# Patient Record
Sex: Female | Born: 1975
Health system: Southern US, Community
[De-identification: ages and names within clinical notes are randomized; demographics above are authoritative.]

## PROBLEM LIST (undated history)

## (undated) DIAGNOSIS — F79 Unspecified intellectual disabilities: Secondary | ICD-10-CM

## (undated) DIAGNOSIS — D219 Benign neoplasm of connective and other soft tissue, unspecified: Secondary | ICD-10-CM

## (undated) HISTORY — PX: OTHER SURGICAL HISTORY: SHX169

## (undated) HISTORY — PX: TONSILLECTOMY: SUR1361

## (undated) HISTORY — PX: HERNIA REPAIR: SHX51

## (undated) HISTORY — PX: ABDOMINAL HYSTERECTOMY: SHX81

## (undated) HISTORY — DX: Benign neoplasm of connective and other soft tissue, unspecified: D21.9

## (undated) HISTORY — PX: DILATION AND CURETTAGE OF UTERUS: SHX78

---

## 1997-06-23 ENCOUNTER — Ambulatory Visit (HOSPITAL_COMMUNITY): Admission: RE | Admit: 1997-06-23 | Discharge: 1997-06-23 | Payer: Self-pay

## 1997-08-04 ENCOUNTER — Other Ambulatory Visit: Admission: RE | Admit: 1997-08-04 | Discharge: 1997-08-04 | Payer: Self-pay | Admitting: Obstetrics and Gynecology

## 1997-11-09 ENCOUNTER — Encounter: Admission: RE | Admit: 1997-11-09 | Discharge: 1997-11-09 | Payer: Self-pay | Admitting: Family Medicine

## 1997-11-16 ENCOUNTER — Encounter: Admission: RE | Admit: 1997-11-16 | Discharge: 1997-11-16 | Payer: Self-pay | Admitting: Family Medicine

## 1998-02-08 ENCOUNTER — Encounter: Admission: RE | Admit: 1998-02-08 | Discharge: 1998-02-08 | Payer: Self-pay | Admitting: Family Medicine

## 1998-02-15 ENCOUNTER — Encounter: Admission: RE | Admit: 1998-02-15 | Discharge: 1998-02-15 | Payer: Self-pay | Admitting: Family Medicine

## 1998-08-11 ENCOUNTER — Encounter: Admission: RE | Admit: 1998-08-11 | Discharge: 1998-08-11 | Payer: Self-pay | Admitting: Family Medicine

## 1998-08-21 ENCOUNTER — Encounter: Admission: RE | Admit: 1998-08-21 | Discharge: 1998-08-21 | Payer: Self-pay | Admitting: Family Medicine

## 1999-04-04 ENCOUNTER — Encounter: Admission: RE | Admit: 1999-04-04 | Discharge: 1999-04-04 | Payer: Self-pay | Admitting: Family Medicine

## 1999-06-17 ENCOUNTER — Encounter (INDEPENDENT_AMBULATORY_CARE_PROVIDER_SITE_OTHER): Payer: Self-pay | Admitting: *Deleted

## 1999-06-26 ENCOUNTER — Encounter: Admission: RE | Admit: 1999-06-26 | Discharge: 1999-06-26 | Payer: Self-pay | Admitting: Family Medicine

## 2000-07-16 ENCOUNTER — Encounter: Admission: RE | Admit: 2000-07-16 | Discharge: 2000-07-16 | Payer: Self-pay | Admitting: Family Medicine

## 2000-12-17 ENCOUNTER — Encounter: Admission: RE | Admit: 2000-12-17 | Discharge: 2000-12-17 | Payer: Self-pay | Admitting: Sports Medicine

## 2001-01-21 ENCOUNTER — Encounter: Admission: RE | Admit: 2001-01-21 | Discharge: 2001-01-21 | Payer: Self-pay | Admitting: Family Medicine

## 2001-06-16 ENCOUNTER — Encounter: Admission: RE | Admit: 2001-06-16 | Discharge: 2001-06-16 | Payer: Self-pay | Admitting: Family Medicine

## 2001-12-23 ENCOUNTER — Encounter: Admission: RE | Admit: 2001-12-23 | Discharge: 2001-12-23 | Payer: Self-pay | Admitting: Family Medicine

## 2002-11-03 ENCOUNTER — Encounter: Admission: RE | Admit: 2002-11-03 | Discharge: 2002-11-03 | Payer: Self-pay | Admitting: Family Medicine

## 2003-01-21 ENCOUNTER — Encounter: Admission: RE | Admit: 2003-01-21 | Discharge: 2003-01-21 | Payer: Self-pay | Admitting: Sports Medicine

## 2003-01-26 ENCOUNTER — Encounter: Admission: RE | Admit: 2003-01-26 | Discharge: 2003-01-26 | Payer: Self-pay | Admitting: Family Medicine

## 2003-03-25 ENCOUNTER — Encounter: Admission: RE | Admit: 2003-03-25 | Discharge: 2003-03-25 | Payer: Self-pay | Admitting: Family Medicine

## 2003-05-24 ENCOUNTER — Encounter: Admission: RE | Admit: 2003-05-24 | Discharge: 2003-05-24 | Payer: Self-pay | Admitting: Family Medicine

## 2003-06-20 ENCOUNTER — Encounter: Admission: RE | Admit: 2003-06-20 | Discharge: 2003-06-20 | Payer: Self-pay | Admitting: Family Medicine

## 2004-01-26 ENCOUNTER — Ambulatory Visit: Payer: Self-pay | Admitting: Sports Medicine

## 2004-06-27 ENCOUNTER — Ambulatory Visit: Payer: Self-pay | Admitting: Family Medicine

## 2004-07-17 ENCOUNTER — Ambulatory Visit: Payer: Self-pay | Admitting: Family Medicine

## 2004-07-24 ENCOUNTER — Encounter: Admission: RE | Admit: 2004-07-24 | Discharge: 2004-07-24 | Payer: Self-pay | Admitting: Sports Medicine

## 2005-07-17 ENCOUNTER — Ambulatory Visit: Payer: Self-pay | Admitting: Family Medicine

## 2005-08-08 ENCOUNTER — Ambulatory Visit: Payer: Self-pay | Admitting: Sports Medicine

## 2005-08-26 ENCOUNTER — Ambulatory Visit: Payer: Self-pay | Admitting: Sports Medicine

## 2005-08-28 ENCOUNTER — Encounter: Admission: RE | Admit: 2005-08-28 | Discharge: 2005-08-28 | Payer: Self-pay | Admitting: Family Medicine

## 2006-02-28 ENCOUNTER — Ambulatory Visit: Payer: Self-pay | Admitting: Sports Medicine

## 2006-05-15 DIAGNOSIS — E663 Overweight: Secondary | ICD-10-CM

## 2006-05-15 DIAGNOSIS — J309 Allergic rhinitis, unspecified: Secondary | ICD-10-CM | POA: Insufficient documentation

## 2006-05-15 DIAGNOSIS — J45990 Exercise induced bronchospasm: Secondary | ICD-10-CM | POA: Insufficient documentation

## 2006-05-15 DIAGNOSIS — F79 Unspecified intellectual disabilities: Secondary | ICD-10-CM | POA: Insufficient documentation

## 2006-05-15 DIAGNOSIS — I10 Essential (primary) hypertension: Secondary | ICD-10-CM

## 2006-05-16 ENCOUNTER — Encounter (INDEPENDENT_AMBULATORY_CARE_PROVIDER_SITE_OTHER): Payer: Self-pay | Admitting: *Deleted

## 2007-12-25 ENCOUNTER — Ambulatory Visit: Payer: Self-pay | Admitting: Family Medicine

## 2008-06-24 ENCOUNTER — Ambulatory Visit: Payer: Self-pay | Admitting: Family Medicine

## 2010-01-12 ENCOUNTER — Ambulatory Visit: Payer: Self-pay | Admitting: Family Medicine

## 2010-04-19 NOTE — Assessment & Plan Note (Signed)
Summary: cpe,tcb   Vital Signs:  Patient profile:   35 year old female Height:      57.75 inches Weight:      158 pounds Temp:     98.6 degrees F oral Pulse rate:   80 / minute Pulse rhythm:   regular BP sitting:   149 / 100  (left arm) Cuff size:   regular  Vitals Entered By: Loralee Pacas CMA (January 12, 2010 3:47 PM)  Serial Vital Signs/Assessments:  Time      Position  BP       Pulse  Resp  Temp     By 3:51 PM             139/94   81                    Loralee Pacas CMA  CC: CPE   Primary Care Provider:  Jamie Brookes MD  CC:  CPE.  History of Present Illness: no concerns  wants to bowl with [employer].   here with aunt who wants to change diet rather than start meds.  currently eating high sal/fat diet  Current Medications (verified): 1)  None  Allergies (verified): No Known Drug Allergies  Review of Systems       The patient complains of weight gain.  The patient denies anorexia, fever, and weight loss.    Physical Exam  General:  vs reviewedwell-developed, well-nourished, and well-hydrated.   Head:  normocephalic and atraumatic.   Eyes:  vision grossly intact.   Ears:  R ear normal and L ear normal.  let ear with tip of qtip inside Nose:  clear rhinorhea Mouth:  mmm Lungs:  Normal respiratory effort, chest expands symmetrically. Lungs are clear to auscultation, no crackles or wheezes. Heart:  Normal rate and regular rhythm. S1 and S2 normal without gallop, murmur, click, rub or other extra sounds. Abdomen:  Bowel sounds positive,abdomen soft and non-tender without masses, organomegaly or hernias noted. Genitalia:  deferred Skin:  Intact without suspicious lesions or rashes Psych:  normally interactive and good eye contact.     Impression & Recommendations:  Problem # 1:  HEALTH SCREENING (ICD-V70.0) Assessment Unchanged unable to do pap, pt intolerant.  no sexual activity.  BP elevated.  aunt to help pt change diet.  RTC in 3 months for  recheck, consider starting BP meds.   Orders: [facility] - Est  18-39 yrs [phone])  Other Orders: Future Orders: [facility] [phone]) ... 01/10/2011  Patient Instructions: 1)  Please make an appt  to come back for lab work.  we will check your blood salts and sugars.  After you get that done, Dr. Clotilde Dieter will talk about starting a medication for your blood pressure.   2)  Have fun bowling!   Orders Added: 1)  [facility] [[id number]] 2)  [facility] - Est  18-39 yrs [[id number]]  Appended Document: cpe,tcb     Vision Screening:Left eye w/o correction: 20 / 25 Right Eye w/o correction: 20 / 25 Both eyes w/o correction:  20/ 25        Vision Entered By: Loralee Pacas CMA (January 12, 2010 4:39 PM)  Hearing Screen  20db HL: Left  500 hz: 20db 1000 hz: 20db 2000 hz: 20db 4000 hz: 20db Right  500 hz: 20db 1000 hz: 20db 2000 hz: 20db 4000 hz: 20db   Hearing Testing Entered By: Loralee Pacas CMA (January 12, 2010 4:39 PM)   Allergies: No Known Drug  Allergies

## 2010-10-22 ENCOUNTER — Ambulatory Visit: Payer: Self-pay | Admitting: Sports Medicine

## 2011-06-11 ENCOUNTER — Encounter: Payer: Self-pay | Admitting: Family Medicine

## 2011-06-11 ENCOUNTER — Ambulatory Visit (INDEPENDENT_AMBULATORY_CARE_PROVIDER_SITE_OTHER): Payer: Medicare Other | Admitting: Family Medicine

## 2011-06-11 VITALS — BP 110/82 | HR 86 | Temp 97.9°F | Ht <= 58 in | Wt 154.0 lb

## 2011-06-11 DIAGNOSIS — Z0000 Encounter for general adult medical examination without abnormal findings: Secondary | ICD-10-CM

## 2011-06-13 ENCOUNTER — Encounter: Payer: Self-pay | Admitting: Family Medicine

## 2011-06-13 NOTE — Progress Notes (Signed)
Subjective:     Catherine Hays is an 36 y.o. female who presents for summer camp physical exam. She and her mother deny any current medical problems or concerns. Questionnaire and forms reviewed with patient and responses verified. Immunizations are up to date. Patient vaccinations are up to date.  The following portions of the patient's history were reviewed and updated as appropriate: allergies, current medications, past family history, past medical history, past social history, past surgical history and problem list.  Review of Systems A comprehensive review of systems was negative.     Objective:    BP 110/82  Pulse 86  Temp(Src) 97.9 F (36.6 C) (Oral)  Ht 4\' 10"  (1.473 m)  Wt 154 lb (69.854 kg)  BMI 32.19 kg/m2 BP 110/82  Pulse 86  Temp(Src) 97.9 F (36.6 C) (Oral)  Ht 4\' 10"  (1.473 m)  Wt 154 lb (69.854 kg)  BMI 32.19 kg/m2  General Appearance:    Alert, cooperative, no distress, appears stated age  Head:    Normocephalic, without obvious abnormality, atraumatic  Eyes:    PERRL, conjunctiva/corneas clear, EOM's intact  Ears:    Normal TM's and external ear canals, both ears  Nose:   Nares normal, septum midline, mucosa normal, no drainage    or sinus tenderness  Throat:   Lips, mucosa, and tongue normal; teeth and gums normal  Neck:   Supple, symmetrical, trachea midline, no adenopathy;    thyroid:  no enlargement/tenderness/nodules; no carotid   bruit or JVD  Back:     Symmetric, no curvature, ROM normal, no CVA tenderness  Lungs:     Clear to auscultation bilaterally, respirations unlabored  Chest Wall:    No tenderness or deformity   Heart:    Regular rate and rhythm, S1 and S2 normal, no murmur, rub   or gallop  Breast Exam:    Deferred  Abdomen:     Soft, non-tender, bowel sounds active all four quadrants,    no masses, no organomegaly  Genitalia:    Deferred  Rectal:   Deferred  Extremities:   Extremities normal, atraumatic, no cyanosis or edema  Pulses:    2+ and symmetric all extremities  Skin:   Skin color, texture, turgor normal, no rashes or lesions  Lymph nodes:   Cervical, supraclavicular, and axillary nodes normal  Neurologic:   CNII-XII grossly intact, normal strength      Assessment:    Satisfactory summer camp physical exam in a 36 year old female with mental disability.    Plan:    1. Completed, signed and returned forms. 2. Reviewed health maintenance. 3. Follow up will be as needed.

## 2013-10-15 ENCOUNTER — Ambulatory Visit (INDEPENDENT_AMBULATORY_CARE_PROVIDER_SITE_OTHER): Payer: Medicare Other | Admitting: Family Medicine

## 2013-10-15 ENCOUNTER — Encounter: Payer: Self-pay | Admitting: Family Medicine

## 2013-10-15 VITALS — BP 136/71 | HR 70 | Temp 99.3°F | Wt 170.0 lb

## 2013-10-15 DIAGNOSIS — N920 Excessive and frequent menstruation with regular cycle: Secondary | ICD-10-CM

## 2013-10-15 DIAGNOSIS — R5381 Other malaise: Secondary | ICD-10-CM

## 2013-10-15 DIAGNOSIS — R5383 Other fatigue: Secondary | ICD-10-CM

## 2013-10-15 DIAGNOSIS — N970 Female infertility associated with anovulation: Secondary | ICD-10-CM

## 2013-10-15 DIAGNOSIS — N938 Other specified abnormal uterine and vaginal bleeding: Secondary | ICD-10-CM | POA: Insufficient documentation

## 2013-10-15 DIAGNOSIS — N921 Excessive and frequent menstruation with irregular cycle: Secondary | ICD-10-CM

## 2013-10-15 LAB — POCT URINALYSIS DIPSTICK
Bilirubin, UA: NEGATIVE
GLUCOSE UA: NEGATIVE
KETONES UA: NEGATIVE
Leukocytes, UA: NEGATIVE
Nitrite, UA: NEGATIVE
PH UA: 7
RBC UA: NEGATIVE
SPEC GRAV UA: 1.02
Urobilinogen, UA: 1

## 2013-10-15 LAB — CBC
HCT: 40 % (ref 36.0–46.0)
HEMOGLOBIN: 13.2 g/dL (ref 12.0–15.0)
MCH: 28.7 pg (ref 26.0–34.0)
MCHC: 33 g/dL (ref 30.0–36.0)
MCV: 87 fL (ref 78.0–100.0)
PLATELETS: 280 10*3/uL (ref 150–400)
RBC: 4.6 MIL/uL (ref 3.87–5.11)
RDW: 14.8 % (ref 11.5–15.5)
WBC: 4.5 10*3/uL (ref 4.0–10.5)

## 2013-10-15 LAB — POCT URINE PREGNANCY: Preg Test, Ur: NEGATIVE

## 2013-10-15 MED ORDER — MEDROXYPROGESTERONE ACETATE 10 MG PO TABS: 10.0000 mg | ORAL_TABLET | Freq: Every day | ORAL | Status: DC

## 2013-10-15 NOTE — Patient Instructions (Signed)
Please take the 10 mg Megace pill, once per day for 7-10 days.  If you do not see an improvement, please follow up in 1-2 weeks.  Thanks, Dr. Awanda Mink

## 2013-10-15 NOTE — Assessment & Plan Note (Addendum)
Obtain Upreg, UA, and CBC today.  If U preg negative, will go ahead with Provera 10 mg x 7 days to see if this alleviates her Sx.  Other options would be shot of Depo since pt and care taker have trouble with compliance, do not believe OCP would be appropriate.  Cause of her bleeding could be multifactorial including endocrine related, withdrawal bleeding, PCOS.  Would consider further evaluation in future including PCOS w/u, transvaginal US, LH:FSH, etc.  No concern for malignancy right now as no family hx of this, no weight loss, and denies any smoking, CVD hx.

## 2013-10-15 NOTE — Progress Notes (Signed)
Catherine Hays is a 38 y.o. female who presents today for dysfunctional uterine bleeding.  Pt history provided by care giver who states pt is mentally challenged and is not sure of most of the history.  Per her care giver, she states that the pt has had ongoing spotting for the last 6-8 weeks but before this has had regular menses.  She denies excessive tampon use or pads during this current time frame and states that she has never had this before.  As well, she denies any dizziness, blurred vision, pre-syncope, CP/SOB, abdominal pain, dysuria.  She does not smoke, does not have FHx or personal hx of Uterine/Breast/Ovarian CA, and has never had clot or DVT in the past.    Past Medical History  Diagnosis Date  . Asthma     History  Smoking status  . Never Smoker   Smokeless tobacco  . Not on file    No family history on file.  No current outpatient prescriptions on file prior to visit.   No current facility-administered medications on file prior to visit.    ROS: Per HPI.  All other systems reviewed and are negative.   Physical Exam Filed Vitals:   10/15/13 1445  BP: 136/71  Pulse: 70  Temp: 99.3 F (37.4 C)    Physical Examination: General appearance - alert, well appearing, and in no distress Chest - clear to auscultation, no wheezes, rales or rhonchi, symmetric air entry Heart - normal rate and regular rhythm, no murmurs noted Abdomen - soft, nontender, nondistended, no masses or organomegaly

## 2013-10-16 LAB — TSH: TSH: 2.636 u[IU]/mL (ref 0.350–4.500)

## 2013-11-04 ENCOUNTER — Telehealth: Payer: Self-pay | Admitting: Family Medicine

## 2013-11-04 NOTE — Telephone Encounter (Signed)
Catherine Hays is calling because she has some questions about the medication Provera and how her monthly cycle should be. Please call her with advise. [email or web address]

## 2013-11-04 NOTE — Telephone Encounter (Signed)
Left message for pt's caregiver to return nurse call.  She may ask to speak with Catherine Hays or Catherine Hays.  Derl Barrow, RN

## 2014-09-09 ENCOUNTER — Ambulatory Visit: Payer: Medicare Other | Admitting: Family Medicine

## 2014-09-15 ENCOUNTER — Ambulatory Visit: Payer: Medicare Other | Admitting: Family Medicine

## 2015-11-16 ENCOUNTER — Ambulatory Visit (HOSPITAL_COMMUNITY)
Admission: EM | Admit: 2015-11-16 | Discharge: 2015-11-16 | Disposition: A | Discharge status: Home / Self Care | Payer: Medicare Other | Attending: Internal Medicine | Admitting: Internal Medicine

## 2015-11-16 ENCOUNTER — Encounter (HOSPITAL_COMMUNITY): Payer: Self-pay | Admitting: Emergency Medicine

## 2015-11-16 DIAGNOSIS — L509 Urticaria, unspecified: Secondary | ICD-10-CM

## 2015-11-16 MED ORDER — TRIAMCINOLONE ACETONIDE 40 MG/ML IJ SUSP
40.0000 mg | Freq: Once | INTRAMUSCULAR | Status: AC
  Administered 2015-11-16: 40 mg via INTRAMUSCULAR

## 2015-11-16 MED ORDER — PREDNISONE 20 MG PO TABS: ORAL_TABLET | ORAL | 0 refills | Status: DC

## 2015-11-16 MED ORDER — TRIAMCINOLONE ACETONIDE 40 MG/ML IJ SUSP
INTRAMUSCULAR | Status: AC
  Filled 2015-11-16: qty 1

## 2015-11-16 NOTE — Discharge Instructions (Signed)
You will receive a prednisone type of injection today to help with your symptoms and rash. Tomorrow start taking the prednisone as directed. Take it with food. Also take an antihistamine, one of the following, Allegra, Claritin or Zyrtec daily. These are for itching, rash and/or swelling. If she continues to have hives may need to follow-up with her primary care doctor for allergy testing.

## 2015-11-16 NOTE — ED Triage Notes (Signed)
Generalized rash, torso and extremities.  Red splotchy areas of skin, sometimes itch.  No known source.  Onset today of rash.  Mother reports no changes in soaps, etc

## 2015-11-16 NOTE — ED Provider Notes (Signed)
CSN: [id number]     Arrival date & time 11/16/15  1422 History   First MD Initiated Contact with Patient 11/16/15 1516     No chief complaint on file.  (Consider location/radiation/quality/duration/timing/severity/associated sxs/prior Treatment) 40 year old female with MR is accompanied by her guardian to the urgent care with complaint of a rash that developed approximately 3 hours ago. The rash is generalized. It is typical of urticaria. She denies cough, trouble breathing or swelling. Is not taking any medications, no new foods or known topical agents.      Past Medical History:  Diagnosis Date  . Asthma    Past Surgical History:  Procedure Laterality Date  . none     No family history on file. Social History  Substance Use Topics  . Smoking status: Never Smoker  . Smokeless tobacco: Not on file  . Alcohol use No   OB History    No data available     Review of Systems  Constitutional: Negative.   HENT: Negative.   Eyes: Negative.   Respiratory: Negative.  Negative for cough, shortness of breath and stridor.   Skin: Positive for rash.  All other systems reviewed and are negative.   Allergies  Review of patient's allergies indicates no known allergies.  Home Medications   Prior to Admission medications   Medication Sig Start Date End Date Taking? Authorizing Provider  medroxyPROGESTERone (PROVERA) 10 MG tablet Take 1 tablet (10 mg total) by mouth daily. 10/15/13   Tamela Oddi Hess, DO  predniSONE (DELTASONE) 20 MG tablet 3 Tabs PO Days 1-3, then 2 tabs PO Days 4-6, then 1 tab PO Day 7-9, then Half Tab PO Day 10-12 11/16/15   Janne Napoleon, NP   Meds Ordered and Administered this Visit   Medications  triamcinolone acetonide (KENALOG-40) injection 40 mg (not administered)    BP 145/99 (BP Location: Right Arm)   Pulse 62   Temp 98.4 F (36.9 C) (Oral)   Resp 12   SpO2 100%  No data found.   Physical Exam  Constitutional: She is oriented to person, place, and  time. She appears well-developed and well-nourished. No distress.  HENT:  Head: Normocephalic and atraumatic.  Mouth/Throat: Oropharynx is clear and moist. No oropharyngeal exudate.  No intraoral edema or erythema. Airway widely patent.  Eyes: EOM are normal.  Neck: Normal range of motion. Neck supple.  Cardiovascular: Normal rate, regular rhythm and normal heart sounds.   Pulmonary/Chest: Effort normal and breath sounds normal. No respiratory distress. She has no wheezes. She has no rales.  Musculoskeletal: She exhibits no edema.  Neurological: She is alert and oriented to person, place, and time. She exhibits normal muscle tone.  Skin: Skin is warm and dry. Rash noted.  Rash consistent primarily annular or roughly annular cutaneous, erythematous, blanching lesions distributed to all body surface areas. Positive for pruritus. No swelling is observed.  Psychiatric: She has a normal mood and affect.  Nursing note and vitals reviewed.   Urgent Care Course   Clinical Course    Procedures (including critical care time)  Labs Review Labs Reviewed - No data to display  Imaging Review No results found.   Visual Acuity Review  Right Eye Distance:   Left Eye Distance:   Bilateral Distance:    Right Eye Near:   Left Eye Near:    Bilateral Near:         MDM   1. Urticaria    You will receive a prednisone type of  injection today to help with your symptoms and rash. Tomorrow start taking the prednisone as directed. Take it with food. Also take an antihistamine, one of the following, Allegra, Claritin or Zyrtec daily. These are for itching, rash and/or swelling. If she continues to have hives may need to follow-up with her primary care doctor for allergy testing. Meds ordered this encounter  Medications  . triamcinolone acetonide (KENALOG-40) injection 40 mg  . predniSONE (DELTASONE) 20 MG tablet    Sig: 3 Tabs PO Days 1-3, then 2 tabs PO Days 4-6, then 1 tab PO Day 7-9,  then Half Tab PO Day 10-12    Dispense:  20 tablet    Refill:  0    Order Specific Question:   Supervising Provider    Answer:   Sherlene Shams [id number]       Janne Napoleon, NP 11/16/15 1531

## 2016-02-22 ENCOUNTER — Ambulatory Visit (INDEPENDENT_AMBULATORY_CARE_PROVIDER_SITE_OTHER): Payer: Medicare Other

## 2016-02-22 DIAGNOSIS — Z23 Encounter for immunization: Secondary | ICD-10-CM

## 2016-02-23 DIAGNOSIS — Z23 Encounter for immunization: Secondary | ICD-10-CM

## 2016-02-27 ENCOUNTER — Ambulatory Visit: Payer: Medicare Other

## 2016-05-22 NOTE — Progress Notes (Signed)
HPI:   Catherine Hays is a 41 y.o. female, who is here today with her aunt (Catherine Hays) to establish care. Catherine Hays is her caregiver and she provides most of information today.   Former PCP: Dr Jess Barters.  Last preventive routine visit: 2013.  Chronic medical problems: HTN,asthma, allergic rhinitis,mental retardation of unknown etiology. She has not had asthma symptoms in about 8 years.   She lives with aunt, moved with her at age 64, after her mother passed away due to breast cancer. Catherine Hays is not aware of any complication during birth that could have caused MR.   ADL's: Independent. IADL's: Dependent. She is able to help with some chores around the house except for cooking, according, to Catherine Rudean Hitt is not able" to do so. She works part time at [employer]. 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 She also attends summer camp every year.   Concerns today: Irregular menses.  According to Catherine Rudean Hitt has had heavy and irregular menses for years, since menarche. No dysmenorrhea. Cycles are from 2 weeks to a few months and last 9-10 days LMP from 05/15/16 to 05/21/16. She was on Provera in the past, has not taken treatment since age 68.  She has not had a pap smear in years. Denies sexual activity.  She has not noted vaginal discharge or urinary symptoms. She has Hx of constipation.  Denies abdominal pain, nausea, vomiting, changes in bowel habits, blood in stool or melena.  -In general she follows a healthy diet but does not exercises regularly.   Review of Systems  Constitutional: Negative for activity change, appetite change, fatigue and unexpected weight change.  HENT: Negative for mouth sores, nosebleeds and trouble swallowing.   Eyes: Negative for redness and visual disturbance.  Respiratory: Negative for cough, shortness of breath and wheezing.   Cardiovascular: Negative for chest pain, palpitations and leg swelling.    Gastrointestinal: Positive for constipation. Negative for abdominal pain, nausea and vomiting.       Negative for changes in bowel habits.  Genitourinary: Positive for menstrual problem. Negative for decreased urine volume, dysuria, hematuria and vaginal discharge.  Musculoskeletal: Negative for gait problem and myalgias.  Skin: Negative for rash.  Allergic/Immunologic: Positive for environmental allergies.  Neurological: Negative for seizures, syncope, weakness and headaches.  Hematological: Negative for adenopathy. Does not bruise/bleed easily.  Psychiatric/Behavioral: Negative for confusion and sleep disturbance. The patient is not nervous/anxious.       No current outpatient prescriptions on file prior to visit.   No current facility-administered medications on file prior to visit.      Past Medical History:  Diagnosis Date  . Asthma    No Known Allergies  Family History  Problem Relation Age of Onset  . Cancer Mother     breast  . Heart disease Mother   . Hypertension Mother   . Heart disease Father   . Hypertension Father   . Depression Maternal Aunt   . Hypertension Maternal Aunt   . Kidney disease Maternal Aunt     Social History   Social History  . Marital status: Single    Spouse name: N/A  . Number of children: N/A  . Years of education: N/A   Social History Main Topics  . Smoking status: Never Smoker  . Smokeless tobacco: Never Used  . Alcohol use No  . Drug use: No  . Sexual activity: No   Other Topics Concern  .  None   Social History Narrative  . None    Vitals:   05/23/16 1034  BP: 120/80  Pulse: 77  Resp: 12   O2 sat at RA 98%. Body mass index is 34.75 kg/m.   Physical Exam  Nursing note and vitals reviewed. Constitutional: She appears well-developed. She is cooperative. No distress.  HENT:  Head: Atraumatic.  Mouth/Throat: Oropharynx is clear and moist and mucous membranes are normal.  Eyes: Conjunctivae and EOM are  normal. Pupils are equal, round, and reactive to light.  Neck: No tracheal deviation present. No thyroid mass and no thyromegaly present.  Cardiovascular: Normal rate and regular rhythm.   No murmur heard. Pulses:      Dorsalis pedis pulses are 2+ on the right side, and 2+ on the left side.  Respiratory: Effort normal and breath sounds normal. No respiratory distress.  GI: Soft. She exhibits no mass. There is no hepatomegaly. There is no tenderness.  Musculoskeletal: She exhibits no edema or tenderness.  Lymphadenopathy:    She has no cervical adenopathy.  Neurological: She is alert. She has normal strength. No cranial nerve deficit. Coordination and gait normal.  Skin: Skin is warm. No erythema.  Psychiatric:  Well groomed, good eye contact.      ASSESSMENT AND PLAN:   Catherine Hays was seen today for establish care.  Diagnoses and all orders for this visit:  Dysfunctional uterine bleeding  We discussed treatment options: IUD, Depo-Provera, and OCPs among some. Catherine Hays does not think she would be able to take OCPs, she (Catherine Hays) is not interested in IUD or other invasive procedure. She thinks Depo-Provera would be a better option for her. We discussed some side effects, including vaginal spotting, weight gain, and risk of osteoporosis among some. Catherine Hays will call to schedule nurse visit if she decides to go for Depo Provera. She cannot have labs today because of time ,so will plan on CBC next OV (CPE).  BMI 34.0-34.9,adult  We discussed benefits of wt loss as well as adverse effects of obesity. Consistency with healthy diet and physical activity recommended. Daily brisk walking for 15-30 min as tolerated.  Intellectual disability  Unknown etiology. She is cooperative and understands simple instructions.    Breast cancer screening -     MM SCREENING BREAST TOMO BILATERAL; Future      Betty G. Martinique, MD  [facility]. [place]  office.

## 2016-05-23 ENCOUNTER — Encounter: Payer: Self-pay | Admitting: Family Medicine

## 2016-05-23 ENCOUNTER — Ambulatory Visit (INDEPENDENT_AMBULATORY_CARE_PROVIDER_SITE_OTHER): Payer: PPO | Admitting: Family Medicine

## 2016-05-23 VITALS — BP 120/80 | HR 77 | Resp 12 | Ht <= 58 in | Wt 166.2 lb

## 2016-05-23 DIAGNOSIS — F79 Unspecified intellectual disabilities: Secondary | ICD-10-CM | POA: Diagnosis not present

## 2016-05-23 DIAGNOSIS — N938 Other specified abnormal uterine and vaginal bleeding: Secondary | ICD-10-CM

## 2016-05-23 DIAGNOSIS — Z1239 Encounter for other screening for malignant neoplasm of breast: Secondary | ICD-10-CM

## 2016-05-23 DIAGNOSIS — Z6834 Body mass index (BMI) 34.0-34.9, adult: Secondary | ICD-10-CM | POA: Diagnosis not present

## 2016-05-23 DIAGNOSIS — Z1231 Encounter for screening mammogram for malignant neoplasm of breast: Secondary | ICD-10-CM | POA: Diagnosis not present

## 2016-05-23 NOTE — Patient Instructions (Signed)
A few things to remember from today's visit:   Dysfunctional uterine bleeding  BMI 34.0-34.9,adult   Depo Provera shot  Other options: IUD or pills.    What are some tips for weight loss? People become overweight for many reasons. Weight issues can run in families. They can be caused by unhealthy behaviors and a person's environment. Certain health problems and medicines can also lead to weight gain. There are some simple things you can do to reach and maintain a healthy weight:  Eat small more frequent healthy meals instead 3 bid meals. Also Weight Watchers is a good option. Avoid sweet drinks. These include regular soft drinks, fruit juices, fruit drinks, energy drinks, sweetened iced tea, and flavored milk. Avoid fast foods. Fast foods such as french fries, hamburgers, chicken nuggets, and pizza are high in calories and can cause weight gain. Eat a healthy breakfast. People who skip breakfast tend to weigh more. Don't watch more than two hours of television per day. Chew sugar-free gum between meals to cut down on snacking. Avoid grocery shopping when you're hungry. Pack a healthy lunch instead of eating out to control what and how much you eat. Eat a lot of fruits and vegetables. Aim for about 2 cups of fruit and 2 to 3 cups of vegetables per day. Aim for 150 minutes per week of moderate-intensity exercise (such as brisk walking), or 75 minutes per week of vigorous exercise (such as jogging or running). OR 15-30 min of daily brisk walking. Be more active. Small changes in physical activity can easily be added to your daily routine. For example, take the stairs instead of the elevator. Take a walk with your family. A daily walk is a great way to get exercise and to catch up on the day's events.   Please be sure medication list is accurate. If a new problem present, please set up appointment sooner than planned today.

## 2016-05-23 NOTE — Progress Notes (Signed)
Pre visit review using our clinic review tool, if applicable. No additional management support is needed unless otherwise documented below in the visit note. 

## 2016-05-24 ENCOUNTER — Encounter: Payer: Self-pay | Admitting: Family Medicine

## 2016-07-30 NOTE — Progress Notes (Signed)
HPI:   [name] is a 41 y.o. female, who is here today with her aunt for her routine physical.  She needs summer camp physical exam and form fill out. She has attended same summer camp every year.  Hx of mental retardation, she works part time, independent ADL's. She needs assistance for IADL's.   Regular exercise 3 or more time per week: Not regularly. Following a healthy diet: She eats mainly at home, aunts cooks healthy meals. She lives with her aunt, moved with ehr at age 24 after the death of her mother.  Chronic medical problems: MR,constipation,allergic rhinitis,asthma, and HTN.  Pap smear, a few years ago, not sure about date. She has not been sexually active. Hx of dysfunctional uterine bleeding. LMP 2 weeks ago.  Hx of abnormal pap smears: Denies.  Mammogram: She has not had one. Mother had breast cancer.   Concerns today:   "Places" on right arm, pigmented lesions, "moles", she has had one for years but seem to be growing and raising. Another one noted a week ago. She denies tenderness or bleeding.    Review of Systems  Constitutional: Negative for appetite change, fatigue, fever and unexpected weight change.  HENT: Negative for dental problem, hearing loss, nosebleeds, trouble swallowing and voice change.   Eyes: Negative for photophobia and visual disturbance.  Respiratory: Negative for cough, shortness of breath and wheezing.   Cardiovascular: Negative for chest pain and leg swelling.  Gastrointestinal: Negative for abdominal pain, blood in stool, nausea and vomiting.       No changes in bowel habits.  Endocrine: Negative for cold intolerance, heat intolerance, polydipsia, polyphagia and polyuria.  Genitourinary: Positive for menstrual problem. Negative for decreased urine volume, dysuria, hematuria, vaginal bleeding and vaginal discharge.       No breast tenderness or nipple discharge.  Musculoskeletal: Negative for gait problem and  myalgias.  Skin: Negative for rash and wound.  Allergic/Immunologic: Positive for environmental allergies.  Neurological: Negative for syncope, weakness and headaches.  Hematological: Negative for adenopathy. Does not bruise/bleed easily.  Psychiatric/Behavioral: Negative for confusion and sleep disturbance. The patient is not nervous/anxious.   All other systems reviewed and are negative.     No current outpatient prescriptions on file prior to visit.   No current facility-administered medications on file prior to visit.     Past Medical History:  Diagnosis Date  . Asthma     No Known Allergies  Family History  Problem Relation Age of Onset  . Cancer Mother        breast  . Heart disease Mother   . Hypertension Mother   . Heart disease Father   . Hypertension Father   . Depression Maternal Aunt   . Hypertension Maternal Aunt   . Kidney disease Maternal Aunt     Social History   Social History  . Marital status: Single    Spouse name: N/A  . Number of children: N/A  . Years of education: N/A   Social History Main Topics  . Smoking status: Never Smoker  . Smokeless tobacco: Never Used  . Alcohol use No  . Drug use: No  . Sexual activity: No   Other Topics Concern  . None   Social History Narrative  . None     Vitals:   07/31/16 0956  BP: 122/78  Pulse: 98  Resp: 12   Body mass index is 34.54 kg/m.  O2 sat at RA 98%  Wt  Readings from Last 3 Encounters:  07/31/16 165 lb 4 oz (75 kg)  05/23/16 166 lb 4 oz (75.4 kg)  10/15/13 170 lb (77.1 kg)     Physical Exam  Nursing note and vitals reviewed. Constitutional: She appears well-developed. No distress.  HENT:  Head: Atraumatic.  Right Ear: Hearing, tympanic membrane, external ear and ear canal normal.  Left Ear: Hearing, tympanic membrane, external ear and ear canal normal.  Mouth/Throat: Uvula is midline, oropharynx is clear and moist and mucous membranes are normal.  Mild facial  asymmetry and epicanthal folds with flat nose bridge.   Eyes: Conjunctivae and EOM are normal. Pupils are equal, round, and reactive to light.  Neck: No tracheal deviation present. No thyromegaly present.  Cardiovascular: Normal rate and regular rhythm.   No murmur heard. Pulses:      Dorsalis pedis pulses are 2+ on the right side, and 2+ on the left side.  Respiratory: Effort normal and breath sounds normal. No respiratory distress.  GI: Soft. She exhibits mass (seems to be originated in suprapubic area, ?fibroid). There is no hepatomegaly. There is no tenderness.    Genitourinary: No breast swelling or tenderness.  Genitourinary Comments: Breast: No masses or nipple discharge appreciated.  Musculoskeletal: She exhibits no edema or tenderness.       Arms: No major deformity or signs of synovitis appreciated.  Lymphadenopathy:    She has no cervical adenopathy.    She has no axillary adenopathy.  Neurological: She is alert. She has normal strength. No cranial nerve deficit or sensory deficit. Gait normal.  Reflex Scores:      Patellar reflexes are 2+ on the right side and 2+ on the left side. Skin: Skin is warm. No rash noted. No erythema.  1-2 mm hyperpigmented rounded lesions on lateral aspect of right arm.Not tender, mildly raised, smooth, and pigmentation otherwise even. See MS graphical docummentation.   Psychiatric: She has a normal mood and affect. Her speech is delayed.  Appropriately groomed, good eye contact. She is cooperative and participate in conversation. She is able to provide some history and answer basic questions.     ASSESSMENT AND PLAN:    [name] was seen today for annual exam.  Diagnoses and all orders for this visit:  Encounter for general adult medical examination with abnormal findings   We discussed the importance of regular physical activity and healthy diet for prevention of chronic illness and/or complications. Preventive guidelines  reviewed. She needs pap smear but aunt does not want it done today. Recommend arranging appt if interested in doing it, it is recommended. Mammogram is needed, order was placed last OV. Aunt it going to call, not sure if she missed call.  She is not fasting today, her aunt will bring her back next week for fasting labs. Vaccination reported as up to date by aunt, she states that Tdap was given 3 years ago. Form for summer camp filled out. Next CPE in 1 year.   Intellectual disability  Unknown etiology, mild. She is able to help with chores at home, needs helps with some IADL's.  Lipid screening -     Lipid panel; Future  Diabetes mellitus screening -     Basic metabolic panel; Future  Dysfunctional uterine bleeding  On exam ? Fibroid, which could be the cause for dysfunctional uterine bleeding. Aunt is not interested in hormonal treatment,we discussed last OV Depo Provera. Instructed about warning signs. Further recommendations will be given according to labs/imaging results.  -  US Pelvis Complete; Future -     US Transvaginal Non-OB; Future -     CBC; Future   Reassured about skin lesions, not suspicious for malignancy. Offered dermatology referral for discussion and possible Bx but aunt prefers to continue following.    Return in 1 year (on 07/31/2017) for routine. Fasting labs in a week.      Darlis Wragg G. Martinique, MD  [facility]. [place] office.

## 2016-07-31 ENCOUNTER — Ambulatory Visit (INDEPENDENT_AMBULATORY_CARE_PROVIDER_SITE_OTHER): Payer: PPO | Admitting: Family Medicine

## 2016-07-31 ENCOUNTER — Encounter: Payer: Self-pay | Admitting: Family Medicine

## 2016-07-31 VITALS — BP 122/78 | HR 98 | Resp 12 | Ht <= 58 in | Wt 165.2 lb

## 2016-07-31 DIAGNOSIS — Z131 Encounter for screening for diabetes mellitus: Secondary | ICD-10-CM

## 2016-07-31 DIAGNOSIS — Z13220 Encounter for screening for lipoid disorders: Secondary | ICD-10-CM

## 2016-07-31 DIAGNOSIS — Z0001 Encounter for general adult medical examination with abnormal findings: Secondary | ICD-10-CM

## 2016-07-31 DIAGNOSIS — F79 Unspecified intellectual disabilities: Secondary | ICD-10-CM

## 2016-07-31 DIAGNOSIS — N938 Other specified abnormal uterine and vaginal bleeding: Secondary | ICD-10-CM | POA: Diagnosis not present

## 2016-07-31 NOTE — Patient Instructions (Addendum)
A few things to remember from today's visit:   Encounter for general adult medical examination with abnormal findings  Lipid screening - Plan: Lipid panel  Diabetes mellitus screening - Plan: Basic metabolic panel  Physical exam for camp  Dysfunctional uterine bleeding - Plan: US Pelvis Complete, US Transvaginal Non-OB, CBC    At least 150 minutes of moderate exercise per week, daily brisk walking for 15-30 min is a good exercise option. Healthy diet low in saturated (animal) fats and sweets and consisting of fresh fruits and vegetables, lean meats such as fish and white chicken and whole grains.   - Vaccines:  Tdap vaccine every 10 years.  Shingles vaccine recommended at age 54, could be given after 40 years of age but not sure about insurance coverage.  Pneumonia vaccines:  Prevnar 13 at 65 and Pneumovax at 63.  Screening recommendations for low/normal risk women:  Screening for diabetes at age 25-45 and every 3 years.  Cervical cancer prevention:  Needed.  -Breast cancer: Ordered.   Colon cancer screening: starts at 40 years old until 40 years old.  Cholesterol disorder screening at age 22 and every 3 years.  Also recommended:  1. Dental visit- Brush and floss your teeth twice daily; visit your dentist twice a year. 2. Eye doctor- Get an eye exam at least every 2 years. 3. Helmet use- Always wear a helmet when riding a bicycle, motorcycle, rollerblading or skateboarding. 4. Safe sex- If you may be exposed to sexually transmitted infections, use a condom. 5. Seat belts- Seat belts can save your live; always wear one. 6. Smoke/Carbon Monoxide detectors- These detectors need to be installed on the appropriate level of your home. Replace batteries at least once a year. 7. Skin cancer- When out in the sun please cover up and use sunscreen 15 SPF or higher. 8. Violence- If anyone is threatening or hurting you, please tell your healthcare provider.  9. Drink alcohol in  moderation- Limit alcohol intake to one drink or less per day. Never drink and drive.  Please be sure medication list is accurate. If a new problem present, please set up appointment sooner than planned today.

## 2016-08-06 ENCOUNTER — Other Ambulatory Visit (INDEPENDENT_AMBULATORY_CARE_PROVIDER_SITE_OTHER): Payer: PPO

## 2016-08-06 DIAGNOSIS — N938 Other specified abnormal uterine and vaginal bleeding: Secondary | ICD-10-CM | POA: Diagnosis not present

## 2016-08-06 DIAGNOSIS — Z131 Encounter for screening for diabetes mellitus: Secondary | ICD-10-CM | POA: Diagnosis not present

## 2016-08-06 DIAGNOSIS — Z13220 Encounter for screening for lipoid disorders: Secondary | ICD-10-CM

## 2016-08-06 LAB — LIPID PANEL
CHOL/HDL RATIO: 3
Cholesterol: 140 mg/dL (ref 0–200)
HDL: 49.1 mg/dL (ref >39.00)
LDL CALC: 81 mg/dL (ref 0–99)
NONHDL: 90.96
Triglycerides: 52 mg/dL (ref 0.0–149.0)
VLDL: 10.4 mg/dL (ref 0.0–40.0)

## 2016-08-06 LAB — CBC
HEMATOCRIT: 37.8 % (ref 36.0–46.0)
HEMOGLOBIN: 12.5 g/dL (ref 12.0–15.0)
MCHC: 33.2 g/dL (ref 30.0–36.0)
MCV: 85.4 fl (ref 78.0–100.0)
Platelets: 253 10*3/uL (ref 150.0–400.0)
RBC: 4.43 Mil/uL (ref 3.87–5.11)
RDW: 15.7 % — AB (ref 11.5–15.5)
WBC: 4 10*3/uL (ref 4.0–10.5)

## 2016-08-06 LAB — BASIC METABOLIC PANEL
BUN: 11 mg/dL (ref 6–23)
CHLORIDE: 104 meq/L (ref 96–112)
CO2: 25 meq/L (ref 19–32)
CREATININE: 0.91 mg/dL (ref 0.40–1.20)
Calcium: 9 mg/dL (ref 8.4–10.5)
GFR: 87.88 mL/min (ref >60.00)
GLUCOSE: 95 mg/dL (ref 70–99)
Potassium: 4 mEq/L (ref 3.5–5.1)
Sodium: 137 mEq/L (ref 135–145)

## 2016-08-13 ENCOUNTER — Telehealth: Payer: Self-pay | Admitting: Family Medicine

## 2016-08-13 NOTE — Telephone Encounter (Signed)
Do we have her results back?

## 2016-08-13 NOTE — Telephone Encounter (Signed)
Catherine Hays calling back for results of labs done 5/22.

## 2016-08-14 ENCOUNTER — Other Ambulatory Visit: Payer: PPO

## 2016-08-14 NOTE — Telephone Encounter (Signed)
Labs already reviewed, normal otherwise.  Thanks, BJ

## 2016-08-14 NOTE — Telephone Encounter (Signed)
Informed patient's aunt of results and patient's aunt verbalized understanding.

## 2016-08-16 ENCOUNTER — Other Ambulatory Visit: Payer: PPO

## 2016-08-27 ENCOUNTER — Other Ambulatory Visit: Payer: Self-pay

## 2016-08-27 ENCOUNTER — Ambulatory Visit
Admission: RE | Admit: 2016-08-27 | Discharge: 2016-08-27 | Disposition: A | Discharge status: Home / Self Care | Payer: PPO | Source: Ambulatory Visit | Attending: Family Medicine | Admitting: Family Medicine

## 2016-08-27 DIAGNOSIS — N938 Other specified abnormal uterine and vaginal bleeding: Secondary | ICD-10-CM

## 2016-08-27 DIAGNOSIS — N939 Abnormal uterine and vaginal bleeding, unspecified: Secondary | ICD-10-CM | POA: Diagnosis not present

## 2016-08-28 ENCOUNTER — Telehealth: Payer: Self-pay | Admitting: Obstetrics and Gynecology

## 2016-08-28 NOTE — Telephone Encounter (Signed)
Called and left a message for patient to call back to schedule a new patient doctor referral. °

## 2016-09-03 ENCOUNTER — Encounter: Payer: Self-pay | Admitting: Obstetrics and Gynecology

## 2016-09-03 ENCOUNTER — Ambulatory Visit (INDEPENDENT_AMBULATORY_CARE_PROVIDER_SITE_OTHER): Payer: PPO | Admitting: Obstetrics and Gynecology

## 2016-09-03 ENCOUNTER — Other Ambulatory Visit (HOSPITAL_COMMUNITY)
Admission: RE | Admit: 2016-09-03 | Discharge: 2016-09-03 | Disposition: A | Discharge status: Home / Self Care | Payer: PPO | Source: Ambulatory Visit | Attending: Obstetrics and Gynecology | Admitting: Obstetrics and Gynecology

## 2016-09-03 VITALS — BP 110/70 | HR 68 | Resp 16 | Ht <= 58 in | Wt 162.0 lb

## 2016-09-03 DIAGNOSIS — D259 Leiomyoma of uterus, unspecified: Secondary | ICD-10-CM | POA: Diagnosis not present

## 2016-09-03 DIAGNOSIS — R350 Frequency of micturition: Secondary | ICD-10-CM

## 2016-09-03 DIAGNOSIS — Z01411 Encounter for gynecological examination (general) (routine) with abnormal findings: Secondary | ICD-10-CM

## 2016-09-03 DIAGNOSIS — Z124 Encounter for screening for malignant neoplasm of cervix: Secondary | ICD-10-CM

## 2016-09-03 DIAGNOSIS — N939 Abnormal uterine and vaginal bleeding, unspecified: Secondary | ICD-10-CM

## 2016-09-03 LAB — POCT URINALYSIS DIPSTICK
Bilirubin, UA: NEGATIVE
Blood, UA: NEGATIVE
Glucose, UA: NEGATIVE
KETONES UA: NEGATIVE
NITRITE UA: NEGATIVE
PH UA: 8 (ref 5.0–8.0)
PROTEIN UA: NEGATIVE
UROBILINOGEN UA: NEGATIVE U/dL — AB

## 2016-09-03 NOTE — Progress Notes (Signed)
Catherine Hays 41 y.o. [id number] SingleAfrican AmericanF here for annual exam.  The patient was recently seen by her primary, Dr Betty Martinique. She was noted to have a mass in her lower abdomen, suspicious for fibroids. Ultrasound: 10 x 3.7 x 4.3 cm uterus with a 9.7 x 8 x 13.4 cm pedunculated fibroid. Normal ovaries bilaterally. She is not anemic. She bleeds a large part of the month, she bleeds for 5 days to 14 days at a time. She can bleed one or 2 x a month. Changes a pad about every 3 hours. Never sexually active. She has been having this prolonged bleeding x 3-4 years. No cramps. No baseline pain. Some increase in urinary frequency. Up at least one x at night.   The patient has a h/o an intellectual disability, works part time, lives with her aunt.      Patient's last menstrual period was 08/11/2016 (exact date).          Sexually active: No.  The current method of family planning is abstinence.    Exercising: No.  exercise Smoker:  no  Health Maintenance: Pap:  Unsure of when last one was History of abnormal Pap:  no MMG:  2006 u/s left Colonoscopy:  none BMD:   non TDaP:  Within 34yrs Gardasil: not done Poct urine- ph 8.0, wbc tr  reports that she has never smoked. She has never used smokeless tobacco. She reports that she does not drink alcohol or use drugs. 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 Her mother died 42 years ago and she has lived with her 42 ever since.   Past Medical History:  Diagnosis Date  . Asthma   . Fibroid     Past Surgical History:  Procedure Laterality Date  . HERNIA REPAIR     Umbilical, per aunt faye she is unaware of this surgery  . none    . TONSILLECTOMY      No current outpatient prescriptions on file.   No current facility-administered medications for this visit.     Family History  Problem Relation Age of Onset  . Cancer Mother        breast  . Heart disease Mother   . Hypertension Mother   . Heart disease Father   . Hypertension Father    . Depression Maternal Aunt   . Hypertension Maternal Aunt   . Kidney disease Maternal Aunt    Mom with breast cancer, died at 75.   Review of Systems  Constitutional: Negative.   HENT: Negative.   Eyes: Negative.   Respiratory: Negative.   Cardiovascular: Negative.   Gastrointestinal:       Fibroids & irregular bleeding  Endocrine: Negative.   Genitourinary: Negative.   Musculoskeletal: Negative.   Skin: Negative.   Allergic/Immunologic: Negative.   Neurological: Negative.   Hematological: Negative.   Psychiatric/Behavioral: Negative.     Exam:   BP 110/70   Pulse 68   Resp 16   Ht 4' 9.75" (1.467 m)   Wt 162 lb (73.5 kg)   LMP 08/11/2016 (Exact Date)   BMI 34.15 kg/m   Weight change: @WEIGHTCHANGE @ Height:   Height: 4' 9.75" (146.7 cm)  Ht Readings from Last 3 Encounters:  09/03/16 4' 9.75" (1.467 m)  07/31/16 4\' 10"  (1.473 m)  05/23/16 4\' 10"  (1.473 m)    General appearance: alert, cooperative and appears stated age Head: Normocephalic, without obvious abnormality, atraumatic Neck: no adenopathy, supple, symmetrical, trachea midline and thyroid normal to inspection  and palpation Lungs: clear to auscultation bilaterally Cardiovascular: regular rate and rhythm Breasts: normal appearance, no masses or tenderness Abdomen: soft, non-tender; she has a large mass in her lower abdomen, hard to fully outline, she can't relax her abdominal muscles Extremities: extremities normal, atraumatic, no cyanosis or edema Skin: Skin color, texture, turgor normal. No rashes or lesions Lymph nodes: Cervical, supraclavicular, and axillary nodes normal. No abnormal inguinal nodes palpated Neurologic: Grossly normal   Pelvic: External genitalia:  no lesions              Urethra:  normal appearing urethra with no masses, tenderness or lesions              Bartholins and Skenes: normal                 Vagina: normal appearing vagina with normal color and discharge, no lesions               Cervix: no lesions               Bimanual Exam:  Uterus:  enlarged, filling most of her lower abdomen. Exam is limited by the patient tensing her abdomen. Not tender.               Adnexa: no mass, fullness, tenderness               Rectovaginal:deferred  Chaperone was present for exam.  A:  Well Woman with normal exam  Large fibroid uterus, no c/o pain, exam somewhat limited by the patient tensing her abdomen  Abnormal uterine bleeding, not anemic  Urinary frequency  P:   Pap with hpv  TSH  Urine for ua, c&s  Mammogram  I feel the patient should have an endometrial biopsy and don't think she would tolerate that in the office  Recommend hysteroscopy, D&C, possible mirena IUD insertion in the OR  I discussed with the patient and her Aunt (I don't think the patient understood), that if her w/u is negative and since she isn't anemic, her options to control her cycle would be: do nothing, take OCP's, Depo-provera, and a mirena IUD). If she were to have a mirena, I would recommend insertion in the OR   Information on hysteroscopy and the mirena IUD were given  CC: Dr Betty Martinique

## 2016-09-03 NOTE — Patient Instructions (Addendum)
EXERCISE AND DIET:  We recommended that you start or continue a regular exercise program for good health. Regular exercise means any activity that makes your heart beat faster and makes you sweat.  We recommend exercising at least 30 minutes per day at least 3 days a week, preferably 4 or 5.  We also recommend a diet low in fat and sugar.  Inactivity, poor dietary choices and obesity can cause diabetes, heart attack, stroke, and kidney damage, among others.    ALCOHOL AND SMOKING:  Women should limit their alcohol intake to no more than 7 drinks/beers/glasses of wine (combined, not each!) per week. Moderation of alcohol intake to this level decreases your risk of breast cancer and liver damage. And of course, no recreational drugs are part of a healthy lifestyle.  And absolutely no smoking or even second hand smoke. Most people know smoking can cause heart and lung diseases, but did you know it also contributes to weakening of your bones? Aging of your skin?  Yellowing of your teeth and nails?  CALCIUM AND VITAMIN D:  Adequate intake of calcium and Vitamin D are recommended.  The recommendations for exact amounts of these supplements seem to change often, but generally speaking 600 mg of calcium (either carbonate or citrate) and 800 units of Vitamin D per day seems prudent. Certain women may benefit from higher intake of Vitamin D.  If you are among these women, your doctor will have told you during your visit.    PAP SMEARS:  Pap smears, to check for cervical cancer or precancers,  have traditionally been done yearly, although recent scientific advances have shown that most women can have pap smears less often.  However, every woman still should have a physical exam from her gynecologist every year. It will include a breast check, inspection of the vulva and vagina to check for abnormal growths or skin changes, a visual exam of the cervix, and then an exam to evaluate the size and shape of the uterus and  ovaries.  And after 40 years of age, a rectal exam is indicated to check for rectal cancers. We will also provide age appropriate advice regarding health maintenance, like when you should have certain vaccines, screening for sexually transmitted diseases, bone density testing, colonoscopy, mammograms, etc.   MAMMOGRAMS:  All women over 40 years old should have a yearly mammogram. Many facilities now offer a "3D" mammogram, which may cost around $50 extra out of pocket. If possible,  we recommend you accept the option to have the 3D mammogram performed.  It both reduces the number of women who will be called back for extra views which then turn out to be normal, and it is better than the routine mammogram at detecting truly abnormal areas.    COLONOSCOPY:  Colonoscopy to screen for colon cancer is recommended for all women at age 40.  We know, you hate the idea of the prep.  We agree, BUT, having colon cancer and not knowing it is worse!!  Colon cancer so often starts as a polyp that can be seen and removed at colonscopy, which can quite literally save your life!  And if your first colonoscopy is normal and you have no family history of colon cancer, most women don't have to have it again for 10 years.  Once every ten years, you can do something that may end up saving your life, right?  We will be happy to help you get it scheduled when you are ready.    Be sure to check your insurance coverage so you understand how much it will cost.  It may be covered as a preventative service at no cost, but you should check your particular policy.      Dilation and Curettage or Vacuum Curettage, Care After These instructions give you information about caring for yourself after your procedure. Your doctor may also give you more specific instructions. Call your doctor if you have any problems or questions after your procedure. Follow these instructions at home: Activity  Do not drive or use heavy machinery while taking  prescription pain medicine.  For 24 hours after your procedure, avoid driving.  Take short walks often, followed by rest periods. Ask your doctor what activities are safe for you. After one or two days, you may be able to return to your normal activities.  Do not lift anything that is heavier than 10 lb (4.5 kg) until your doctor approves.  For at least 2 weeks, or as long as told by your doctor: ? Do not douche. ? Do not use tampons. ? Do not have sex. General instructions  Take over-the-counter and prescription medicines only as told by your doctor. This is very important if you take blood thinning medicine.  Do not take baths, swim, or use a hot tub until your doctor approves. Take showers instead of baths.  Wear compression stockings as told by your doctor.  It is up to you to get the results of your procedure. Ask your doctor when your results will be ready.  Keep all follow-up visits as told by your doctor. This is important. Contact a doctor if:  You have very bad cramps that get worse or do not get better with medicine.  You have very bad pain in your belly (abdomen).  You cannot drink fluids without throwing up (vomiting).  You get pain in a different part of the area between your belly and thighs (pelvis).  You have bad-smelling discharge from your vagina.  You have a rash. Get help right away if:  You are bleeding a lot from your vagina. A lot of bleeding means soaking more than one sanitary pad in an hour, for 2 hours in a row.  You have clumps of blood (blood clots) coming from your vagina.  You have a fever or chills.  Your belly feels very tender or hard.  You have chest pain.  You have trouble breathing.  You cough up blood.  You feel dizzy.  You feel light-headed.  You pass out (faint).  You have pain in your neck or shoulder area. Summary  Take short walks often, followed by rest periods. Ask your doctor what activities are safe for you.  After one or two days, you may be able to return to your normal activities.  Do not lift anything that is heavier than 10 lb (4.5 kg) until your doctor approves.  Do not take baths, swim, or use a hot tub until your doctor approves. Take showers instead of baths.  Contact your doctor if you have any symptoms of infection, like bad-smelling discharge from your vagina. This information is not intended to replace advice given to you by your health care provider. Make sure you discuss any questions you have with your health care provider. Document Released: 12/12/2007 Document Revised: 11/20/2015 Document Reviewed: 11/20/2015 Elsevier Interactive Patient Education  2017 [place]. Hysteroscopy Hysteroscopy is a procedure used for looking inside the womb (uterus). It may be done for various reasons, including:  To evaluate abnormal bleeding, fibroid (benign, noncancerous) tumors, polyps, scar tissue (adhesions), and possibly cancer of the uterus.  To look for lumps (tumors) and other uterine growths.  To look for causes of why a woman cannot get pregnant (infertility), causes of recurrent loss of pregnancy (miscarriages), or a lost intrauterine device (IUD).  To perform a sterilization by blocking the fallopian tubes from inside the uterus.  In this procedure, a thin, flexible tube with a tiny light and camera on the end of it (hysteroscope) is used to look inside the uterus. A hysteroscopy should be done right after a menstrual period to be sure you are not pregnant. LET [facility] CARE PROVIDER KNOW ABOUT:  Any allergies you have.  All medicines you are taking, including vitamins, herbs, eye drops, creams, and over-the-counter medicines.  Previous problems you or members of your family have had with the use of anesthetics.  Any blood disorders you have.  Previous surgeries you have had.  Medical conditions you have. RISKS AND COMPLICATIONS Generally, this is a safe procedure.  However, as with any procedure, complications can occur. Possible complications include:  Putting a hole in the uterus.  Excessive bleeding.  Infection.  Damage to the cervix.  Injury to other organs.  Allergic reaction to medicines.  Too much fluid used in the uterus for the procedure.  BEFORE THE PROCEDURE  Ask your health care provider about changing or stopping any regular medicines.  Do not take aspirin or blood thinners for 1 week before the procedure, or as directed by your health care provider. These can cause bleeding.  If you smoke, do not smoke for 2 weeks before the procedure.  In some cases, a medicine is placed in the cervix the day before the procedure. This medicine makes the cervix have a larger opening (dilate). This makes it easier for the instrument to be inserted into the uterus during the procedure.  Do not eat or drink anything for at least 8 hours before the surgery.  Arrange for someone to take you home after the procedure. PROCEDURE  You may be given a medicine to relax you (sedative). You may also be given one of the following: ? A medicine that numbs the area around the cervix (local anesthetic). ? A medicine that makes you sleep through the procedure (general anesthetic).  The hysteroscope is inserted through the vagina into the uterus. The camera on the hysteroscope sends a picture to a TV screen. This gives the surgeon a good view inside the uterus.  During the procedure, air or a liquid is put into the uterus, which allows the surgeon to see better.  Sometimes, tissue is gently scraped from inside the uterus. These tissue samples are sent to a lab for testing. What to expect after the procedure  If you had a general anesthetic, you may be groggy for a couple hours after the procedure.  If you had a local anesthetic, you will be able to go home as soon as you are stable and feel ready.  You may have some cramping. This normally lasts for a  couple days.  You may have bleeding, which varies from light spotting for a few days to menstrual-like bleeding for 3-7 days. This is normal.  If your test results are not back during the visit, make an appointment with your health care provider to find out the results. This information is not intended to replace advice given to you by your health care provider. Make sure you  discuss any questions you have with your health care provider. Document Released: 06/10/2000 Document Revised: 08/10/2015 Document Reviewed: 10/01/2012 Elsevier Interactive Patient Education  2017 [employer]  Uterine Fibroids Uterine fibroids are tissue masses (tumors). They are also called leiomyomas. They can develop inside of a woman's womb (uterus). They can grow very large. Fibroids are not cancerous (benign). Most fibroids do not require medical treatment. Follow these instructions at home:  Keep all follow-up visits as told by your doctor. This is important.  Take medicines only as told by your doctor. ? If you were prescribed a hormone treatment, take the hormone medicines exactly as told. ? Do not take aspirin. It can cause bleeding.  Ask your doctor about taking iron pills and increasing the amount of dark green, leafy vegetables in your diet. These actions can help to boost your blood iron levels.  Pay close attention to your period. Tell your doctor about any changes, such as: ? Increased blood flow. This may require you to use more pads or tampons than usual per month. ? A change in the number of days that your period lasts per month. ? A change in symptoms that come with your period, such as back pain or cramping in your belly area (abdomen). Contact a doctor if:  You have pain in your back or the area between your hip bones (pelvic area) that is not controlled by medicines.  You have pain in your abdomen that is not controlled with medicines.  You have an increase in bleeding between and during  periods.  You soak tampons or pads in a half hour or less.  You feel lightheaded.  You feel extra tired.  You feel weak. Get help right away if:  You pass out (faint).  You have a sudden increase in pelvic pain. This information is not intended to replace advice given to you by your health care provider. Make sure you discuss any questions you have with your health care provider. Document Released: 04/06/2010 Document Revised: 11/03/2015 Document Reviewed: 08/31/2013 Elsevier Interactive Patient Education  [employer].

## 2016-09-04 ENCOUNTER — Telehealth: Payer: Self-pay | Admitting: *Deleted

## 2016-09-04 LAB — URINALYSIS, MICROSCOPIC ONLY
BACTERIA UA: NONE SEEN
CASTS: NONE SEEN /LPF

## 2016-09-04 LAB — TSH: TSH: 1.98 u[IU]/mL (ref 0.450–4.500)

## 2016-09-04 LAB — CYTOLOGY - PAP
Diagnosis: NEGATIVE
HPV: NOT DETECTED

## 2016-09-04 NOTE — Telephone Encounter (Signed)
-----   Message from Salvadore Dom, MD sent at 09/04/2016 10:11 AM EDT ----- Please advise the patient of normal results. Pap and urine culture are pending

## 2016-09-04 NOTE — Telephone Encounter (Signed)
Patient's guardian Catherine Hays returning call and also has additional questions.

## 2016-09-04 NOTE — Telephone Encounter (Signed)
Left message to call regarding results -eh 

## 2016-09-04 NOTE — Telephone Encounter (Signed)
I'm working on getting her set up for a hysteroscopy, D&C in the OR to adequately evaluate her. We talked yesterday about possibly placing a mirena IUD, but I don't think her insurance will cover that.  She should be hearing back from our office soon about scheduling.

## 2016-09-04 NOTE — Telephone Encounter (Signed)
Spoke with patients aunt Letta Median) and gave results. Patient's aunt said she felt a little overwhelmed with information yesterday and is not sure what the plan moving forward is for [facility]. Advised that I would talk with Dr. Talbert Nan and get back to her. Patient agreed

## 2016-09-05 LAB — URINE CULTURE

## 2016-09-05 NOTE — Telephone Encounter (Signed)
Spoke with patient's aunt Letta Median) and explained that someone will be calling back in regarding possible surgery. Patients aunt states that Adriannah will be at camp for two weeks and will be returning July 8th. Advised that I would inform our scheduling and nurse supervisor

## 2016-09-12 ENCOUNTER — Telehealth: Payer: Self-pay | Admitting: *Deleted

## 2016-09-12 NOTE — Telephone Encounter (Addendum)
Call from patient's aunt regarding surgery planning. She has discussed benefits with business office and is ready to proceed. Advised that Mirena IUD is not a covered benefit with patients plan. Can proceed with surgery and determine other options pending surgery results.  Date options discussed. Offered 7-9- or 7-16 but family requests Tuesday or Thursday. They prefer 10-08-16. Will proceed with scheduling and call patient's aunt back.   With no IUD, any other instructions or changes prior to surgery ?

## 2016-09-12 NOTE — Telephone Encounter (Signed)
No other changes.

## 2016-09-16 ENCOUNTER — Encounter (HOSPITAL_COMMUNITY): Payer: Self-pay

## 2016-09-24 NOTE — Telephone Encounter (Signed)
Call to patient's guardian, [name], in regards to patient's upcoming surgery. Okay to speak with per DPR. Surgery information form reviewed with patient's aunt and she verbalized understanding. Pre op appointment and 2 week post op appointment scheduled and agreeable to date and time of both appointments. Office number provided for patient's aunt should she have any questions. Aware she will receive a copy of surgery information form at pre op appointment.   Routing to provider for final review. Patient agreeable to disposition. Will close encounter.    cc Lamont Snowball

## 2016-10-01 ENCOUNTER — Ambulatory Visit (INDEPENDENT_AMBULATORY_CARE_PROVIDER_SITE_OTHER): Payer: PPO | Admitting: Obstetrics and Gynecology

## 2016-10-01 ENCOUNTER — Encounter: Payer: Self-pay | Admitting: Obstetrics and Gynecology

## 2016-10-01 VITALS — BP 118/76 | HR 64 | Ht <= 58 in | Wt 162.0 lb

## 2016-10-01 DIAGNOSIS — N939 Abnormal uterine and vaginal bleeding, unspecified: Secondary | ICD-10-CM

## 2016-10-01 DIAGNOSIS — D259 Leiomyoma of uterus, unspecified: Secondary | ICD-10-CM

## 2016-10-01 NOTE — Progress Notes (Signed)
GYNECOLOGY  VISIT   HPI: 41 y.o.   Single  African American  female   G0P0000 with Patient's last menstrual period was 09/15/2016 (approximate).   here for consult prior to [place] The patient bleeds a large part of the month for the last 3-4 years. She has a fibroid uterus. Not anemic. She has an intellectual disability, works part time, lives with her aunt.  Unable to do an adequate pelvic exam in the office (patient tenses).   GYNECOLOGIC HISTORY: Patient's last menstrual period was 09/15/2016 (approximate). Contraception:Abstinence Menopausal hormone therapy: N/A        OB History    Gravida Para Term Preterm AB Living   0 0 0 0 0 0   SAB TAB Ectopic Multiple Live Births   0 0 0 0 0         Patient Active Problem List   Diagnosis Date Noted  . Dysfunctional uterine bleeding 10/15/2013  . BMI 34.0-34.9,adult 05/15/2006  . Intellectual disability 05/15/2006  . RHINITIS, ALLERGIC 05/15/2006  . ASTHMA, EXERCISE INDUCED 05/15/2006    Past Medical History:  Diagnosis Date  . Asthma    8-10 years since having issues with asthma  . Fibroid   . Mentally challenged     Past Surgical History:  Procedure Laterality Date  . HERNIA REPAIR     Umbilical, per aunt faye she is unaware of this surgery  . none    . TONSILLECTOMY      No current outpatient prescriptions on file.   No current facility-administered medications for this visit.      ALLERGIES: Patient has no known allergies.  Family History  Problem Relation Age of Onset  . Cancer Mother        breast  . Heart disease Father   . Hypertension Father   . Heart attack Father   . Hypertension Maternal Aunt   . Kidney disease Maternal Aunt        dialysis  . Thyroid cancer Maternal Grandmother   . Stroke Maternal Grandfather   . Kidney failure Maternal Aunt   . Thyroid cancer Maternal Aunt     Social History   Social History  . Marital status: Single    Spouse  name: N/A  . Number of children: N/A  . Years of education: N/A   Occupational History  . Not on file.   Social History Main Topics  . Smoking status: Never Smoker  . Smokeless tobacco: Never Used  . Alcohol use No  . Drug use: No  . Sexual activity: No   Other Topics Concern  . Not on file   Social History Narrative  . No narrative on file    Review of Systems  Constitutional: Negative.   HENT: Negative.   Eyes: Negative.   Respiratory: Negative.   Cardiovascular: Negative.   Gastrointestinal: Negative.   Genitourinary: Negative.   Musculoskeletal: Negative.   Skin: Negative.   Neurological: Negative.   Endo/Heme/Allergies: Negative.   Psychiatric/Behavioral: Negative.     PHYSICAL EXAMINATION:    BP 118/76 (BP Location: Right Arm, Patient Position: Sitting, Cuff Size: Large)   Pulse 64   Ht 4' 9.75" (1.467 m)   Wt 162 lb (73.5 kg)   LMP 09/15/2016 (Approximate) Comment: per guardian  BMI 34.15 kg/m     General appearance: alert, cooperative and appears stated age Neck: no adenopathy, supple, symmetrical, trachea midline and thyroid normal to inspection and palpation Heart: regular rate and rhythm  Lungs: CTAB Abdomen: soft, non-tender; bowel sounds normal; no masses,  no organomegaly Extremities: normal, atraumatic, no cyanosis Skin: normal color, texture and turgor, no rashes or lesions Lymph: normal cervical supraclavicular and inguinal nodes Neurologic: grossly normal    ASSESSMENT Abnormal uterine bleeding, normal TSH, normal CBC, normal pap Fibroid uterus, 13 cm pedunculated fibroid.  Unable to evaluate in the office    PLAN Plan: hysteroscopy, dilation and curettage. Reviewed risks, including: bleeding, infection, uterine perforation, fluid overload, need for further sugery Her insurance won't cover mirena IUD insertion We discussed the options of OCP's (no contraindications) and depo-provera after her surgery Her Aunt and Cousin were asking  about removing the fibroid, we discussed myomectomy and hysterectomy. Other than the irregular bleeding, her only other c/o is of urinary frequency, they have to decide if it is worth putting her through surgery for urinary frequency if we can fix her bleeding.    An After Visit Summary was printed and given to the patient.   CC: Dr Betty Martinique

## 2016-10-06 NOTE — H&P (Signed)
Versions: 1. Graylon Good, CMA (Certified Psychologist, sport and exercise) at 10/01/2016 3:55 PM - Sign at close encounter    GYNECOLOGY  VISIT   HPI: 41 y.o.   Single  African American  female   G0P0000 with Patient's last menstrual period was 09/15/2016 (approximate).   here for consult prior to [place] The patient bleeds a large part of the month for the last 3-4 years. She has a fibroid uterus. Not anemic. She has an intellectual disability, works part time, lives with her aunt.  Unable to do an adequate pelvic exam in the office (patient tenses).   GYNECOLOGIC HISTORY: Patient's last menstrual period was 09/15/2016 (approximate). Contraception:Abstinence Menopausal hormone therapy: N/A                OB History    Gravida Para Term Preterm AB Living   0 0 0 0 0 0   SAB TAB Ectopic Multiple Live Births   0 0 0 0 0             Patient Active Problem List   Diagnosis Date Noted  . Dysfunctional uterine bleeding 10/15/2013  . BMI 34.0-34.9,adult 05/15/2006  . Intellectual disability 05/15/2006  . RHINITIS, ALLERGIC 05/15/2006  . ASTHMA, EXERCISE INDUCED 05/15/2006        Past Medical History:  Diagnosis Date  . Asthma    8-10 years since having issues with asthma  . Fibroid   . Mentally challenged          Past Surgical History:  Procedure Laterality Date  . HERNIA REPAIR     Umbilical, per aunt faye she is unaware of this surgery  . none    . TONSILLECTOMY      No current outpatient prescriptions on file.   No current facility-administered medications for this visit.      ALLERGIES: Patient has no known allergies.       Family History  Problem Relation Age of Onset  . Cancer Mother        breast  . Heart disease Father   . Hypertension Father   . Heart attack Father   . Hypertension Maternal Aunt   . Kidney disease Maternal Aunt        dialysis  . Thyroid cancer Maternal  Grandmother   . Stroke Maternal Grandfather   . Kidney failure Maternal Aunt   . Thyroid cancer Maternal Aunt     Social History        Social History  . Marital status: Single    Spouse name: N/A  . Number of children: N/A  . Years of education: N/A      Occupational History  . Not on file.       Social History Main Topics  . Smoking status: Never Smoker  . Smokeless tobacco: Never Used  . Alcohol use No  . Drug use: No  . Sexual activity: No       Other Topics Concern  . Not on file      Social History Narrative  . No narrative on file    Review of Systems  Constitutional: Negative.   HENT: Negative.   Eyes: Negative.   Respiratory: Negative.   Cardiovascular: Negative.   Gastrointestinal: Negative.   Genitourinary: Negative.   Musculoskeletal: Negative.   Skin: Negative.   Neurological: Negative.   Endo/Heme/Allergies: Negative.   Psychiatric/Behavioral: Negative.     PHYSICAL EXAMINATION:    BP 118/76 (BP Location: Right Arm, Patient  Position: Sitting, Cuff Size: Large)   Pulse 64   Ht 4' 9.75" (1.467 m)   Wt 162 lb (73.5 kg)   LMP 09/15/2016 (Approximate) Comment: per guardian  BMI 34.15 kg/m     General appearance: alert, cooperative and appears stated age Neck: no adenopathy, supple, symmetrical, trachea midline and thyroid normal to inspection and palpation Heart: regular rate and rhythm Lungs: CTAB Abdomen: soft, non-tender; bowel sounds normal; no masses,  no organomegaly Extremities: normal, atraumatic, no cyanosis Skin: normal color, texture and turgor, no rashes or lesions Lymph: normal cervical supraclavicular and inguinal nodes Neurologic: grossly normal    ASSESSMENT Abnormal uterine bleeding, normal TSH, normal CBC, normal pap Fibroid uterus, 13 cm pedunculated fibroid.  Unable to evaluate in the office    PLAN Plan: hysteroscopy, dilation and curettage. Reviewed risks, including: bleeding,  infection, uterine perforation, fluid overload, need for further sugery Her insurance won't cover mirena IUD insertion We discussed the options of OCP's (no contraindications) and depo-provera after her surgery Her Aunt and Cousin were asking about removing the fibroid, we discussed myomectomy and hysterectomy. Other than the irregular bleeding, her only other c/o is of urinary frequency, they have to decide if it is worth putting her through surgery for urinary frequency if we can fix her bleeding.    An After Visit Summary was printed and given to the patient.   CC: Dr Betty Martinique

## 2016-10-08 ENCOUNTER — Encounter (HOSPITAL_COMMUNITY): Payer: Self-pay | Admitting: Certified Registered Nurse Anesthetist

## 2016-10-08 ENCOUNTER — Ambulatory Visit (HOSPITAL_COMMUNITY): Payer: PPO | Admitting: Anesthesiology

## 2016-10-08 ENCOUNTER — Encounter (HOSPITAL_COMMUNITY)
Admission: RE | Disposition: A | Discharge status: Home / Self Care | Payer: Self-pay | Source: Ambulatory Visit | Attending: Obstetrics and Gynecology

## 2016-10-08 ENCOUNTER — Ambulatory Visit (HOSPITAL_COMMUNITY)
Admission: RE | Admit: 2016-10-08 | Discharge: 2016-10-08 | Disposition: A | Discharge status: Home / Self Care | Payer: PPO | Source: Ambulatory Visit | Attending: Obstetrics and Gynecology | Admitting: Obstetrics and Gynecology

## 2016-10-08 DIAGNOSIS — Z8249 Family history of ischemic heart disease and other diseases of the circulatory system: Secondary | ICD-10-CM | POA: Insufficient documentation

## 2016-10-08 DIAGNOSIS — Z823 Family history of stroke: Secondary | ICD-10-CM | POA: Diagnosis not present

## 2016-10-08 DIAGNOSIS — F79 Unspecified intellectual disabilities: Secondary | ICD-10-CM | POA: Insufficient documentation

## 2016-10-08 DIAGNOSIS — N840 Polyp of corpus uteri: Secondary | ICD-10-CM | POA: Diagnosis not present

## 2016-10-08 DIAGNOSIS — D259 Leiomyoma of uterus, unspecified: Secondary | ICD-10-CM | POA: Diagnosis not present

## 2016-10-08 DIAGNOSIS — Z808 Family history of malignant neoplasm of other organs or systems: Secondary | ICD-10-CM | POA: Insufficient documentation

## 2016-10-08 DIAGNOSIS — Z803 Family history of malignant neoplasm of breast: Secondary | ICD-10-CM | POA: Diagnosis not present

## 2016-10-08 DIAGNOSIS — N938 Other specified abnormal uterine and vaginal bleeding: Secondary | ICD-10-CM | POA: Diagnosis not present

## 2016-10-08 DIAGNOSIS — J45909 Unspecified asthma, uncomplicated: Secondary | ICD-10-CM | POA: Diagnosis not present

## 2016-10-08 DIAGNOSIS — N939 Abnormal uterine and vaginal bleeding, unspecified: Secondary | ICD-10-CM

## 2016-10-08 DIAGNOSIS — J309 Allergic rhinitis, unspecified: Secondary | ICD-10-CM | POA: Diagnosis not present

## 2016-10-08 HISTORY — DX: Unspecified intellectual disabilities: F79

## 2016-10-08 HISTORY — PX: DILATATION & CURETTAGE/HYSTEROSCOPY WITH MYOSURE: SHX6511

## 2016-10-08 LAB — HCG, SERUM, QUALITATIVE: Preg, Serum: NEGATIVE

## 2016-10-08 SURGERY — DILATATION & CURETTAGE/HYSTEROSCOPY WITH MYOSURE
Anesthesia: General

## 2016-10-08 MED ORDER — SODIUM CHLORIDE 0.9 % IR SOLN
Status: DC | PRN
  Administered 2016-10-08: 3000 mL

## 2016-10-08 MED ORDER — OXYCODONE HCL 5 MG/5ML PO SOLN: 5.0000 mg | Freq: Once | ORAL | Status: DC | PRN

## 2016-10-08 MED ORDER — LACTATED RINGERS IV SOLN
INTRAVENOUS | Status: DC
Start: 2016-10-08 — End: 2016-10-08
  Administered 2016-10-08 (×2): via INTRAVENOUS

## 2016-10-08 MED ORDER — MIDAZOLAM HCL 2 MG/2ML IJ SOLN
INTRAMUSCULAR | Status: AC
  Filled 2016-10-08: qty 2

## 2016-10-08 MED ORDER — ONDANSETRON HCL 4 MG/2ML IJ SOLN
INTRAMUSCULAR | Status: DC | PRN
  Administered 2016-10-08: 4 mg via INTRAVENOUS

## 2016-10-08 MED ORDER — LIDOCAINE HCL (CARDIAC) 20 MG/ML IV SOLN
INTRAVENOUS | Status: DC | PRN
  Administered 2016-10-08: 50 mg via INTRAVENOUS

## 2016-10-08 MED ORDER — GLYCOPYRROLATE 0.2 MG/ML IJ SOLN
INTRAMUSCULAR | Status: DC | PRN
  Administered 2016-10-08: 0.2 mg via INTRAVENOUS

## 2016-10-08 MED ORDER — DEXAMETHASONE SODIUM PHOSPHATE 10 MG/ML IJ SOLN
INTRAMUSCULAR | Status: DC | PRN
  Administered 2016-10-08: 4 mg via INTRAVENOUS

## 2016-10-08 MED ORDER — PROPOFOL 10 MG/ML IV BOLUS
INTRAVENOUS | Status: DC | PRN
  Administered 2016-10-08: 140 mg via INTRAVENOUS

## 2016-10-08 MED ORDER — MIDAZOLAM HCL 2 MG/2ML IJ SOLN
INTRAMUSCULAR | Status: DC | PRN
  Administered 2016-10-08 (×2): 1 mg via INTRAVENOUS

## 2016-10-08 MED ORDER — DEXAMETHASONE SODIUM PHOSPHATE 4 MG/ML IJ SOLN
INTRAMUSCULAR | Status: AC
  Filled 2016-10-08: qty 1

## 2016-10-08 MED ORDER — OXYCODONE HCL 5 MG PO TABS: 5.0000 mg | ORAL_TABLET | Freq: Once | ORAL | Status: DC | PRN

## 2016-10-08 MED ORDER — PROMETHAZINE HCL 25 MG/ML IJ SOLN: 6.2500 mg | INTRAMUSCULAR | Status: DC | PRN

## 2016-10-08 MED ORDER — FENTANYL CITRATE (PF) 100 MCG/2ML IJ SOLN: 25.0000 ug | INTRAMUSCULAR | Status: DC | PRN

## 2016-10-08 MED ORDER — PROPOFOL 10 MG/ML IV BOLUS
INTRAVENOUS | Status: AC
  Filled 2016-10-08: qty 20

## 2016-10-08 MED ORDER — FENTANYL CITRATE (PF) 100 MCG/2ML IJ SOLN
INTRAMUSCULAR | Status: DC | PRN
  Administered 2016-10-08 (×2): 50 ug via INTRAVENOUS

## 2016-10-08 MED ORDER — KETOROLAC TROMETHAMINE 30 MG/ML IJ SOLN
INTRAMUSCULAR | Status: DC | PRN
  Administered 2016-10-08: 30 mg via INTRAVENOUS

## 2016-10-08 MED ORDER — ONDANSETRON HCL 4 MG/2ML IJ SOLN
INTRAMUSCULAR | Status: AC
  Filled 2016-10-08: qty 2

## 2016-10-08 MED ORDER — LACTATED RINGERS IV SOLN: INTRAVENOUS | Status: DC

## 2016-10-08 MED ORDER — FENTANYL CITRATE (PF) 100 MCG/2ML IJ SOLN
INTRAMUSCULAR | Status: AC
  Filled 2016-10-08: qty 2

## 2016-10-08 SURGICAL SUPPLY — 18 items
CANISTER SUCT 3000ML PPV (MISCELLANEOUS) ×3 IMPLANT
CATH ROBINSON RED A/P 16FR (CATHETERS) IMPLANT
CLOTH BEACON ORANGE TIMEOUT ST (SAFETY) ×3 IMPLANT
CONTAINER PREFILL 10% NBF 60ML (FORM) ×6 IMPLANT
DEVICE MYOSURE LITE (MISCELLANEOUS) IMPLANT
DEVICE MYOSURE REACH (MISCELLANEOUS) IMPLANT
FILTER ARTHROSCOPY CONVERTOR (FILTER) ×3 IMPLANT
GLOVE BIOGEL PI IND STRL 7.0 (GLOVE) ×2 IMPLANT
GLOVE BIOGEL PI INDICATOR 7.0 (GLOVE) ×4
GLOVE ECLIPSE 6.5 STRL STRAW (GLOVE) ×3 IMPLANT
GOWN STRL REUS W/TWL LRG LVL3 (GOWN DISPOSABLE) ×6 IMPLANT
PACK VAGINAL MINOR WOMEN LF (CUSTOM PROCEDURE TRAY) ×3 IMPLANT
PAD OB MATERNITY 4.3X12.25 (PERSONAL CARE ITEMS) ×3 IMPLANT
SEAL ROD LENS SCOPE MYOSURE (ABLATOR) ×3 IMPLANT
SYR 20CC LL (SYRINGE) IMPLANT
TOWEL OR 17X24 6PK STRL BLUE (TOWEL DISPOSABLE) ×6 IMPLANT
TUBING AQUILEX INFLOW (TUBING) ×3 IMPLANT
TUBING AQUILEX OUTFLOW (TUBING) ×3 IMPLANT

## 2016-10-08 NOTE — Interval H&P Note (Signed)
History and Physical Interval Note:  8/52/7782 42:35 AM  [place]  has presented today for surgery, with the diagnosis of AUB, endometrial polyp   The various methods of treatment have been discussed with the patient and family. After consideration of risks, benefits and other options for treatment, the patient has consented to  Procedure(s) with comments: [place] (N/A) - rep will be here confirmed 10/01/16. as a surgical intervention .  The patient's history has been reviewed, patient examined, no change in status, stable for surgery.  I have reviewed the patient's chart and labs.  Questions were answered to the patient's satisfaction.     Salvadore Dom

## 2016-10-08 NOTE — Transfer of Care (Signed)
Immediate Anesthesia Transfer of Care Note  Patient: Catherine Hays  Procedure(s) Performed: Procedure(s) with comments: [place] (N/A) - rep will be here confirmed 10/01/16.  Patient Location: PACU  Anesthesia Type:General  Level of Consciousness: awake, alert  and oriented  Airway & Oxygen Therapy: Patient Spontanous Breathing and Patient connected to nasal cannula oxygen  Post-op Assessment: Report given to RN and Post -op Vital signs reviewed and stable  Post vital signs: Reviewed and stable  Last Vitals:  Vitals:   10/08/16 0959  BP: (!) 148/103  Pulse: 66  Resp: 18  Temp: 36.8 C    Last Pain:  Vitals:   10/08/16 0959  TempSrc: Oral  PainSc: 0-No pain      Patients Stated Pain Goal: 4 (37/94/32 7614)  Complications: No apparent anesthesia complications

## 2016-10-08 NOTE — Op Note (Signed)
Preoperative Diagnosis: abnormal uterine bleeding, fibroid uterus  Postoperative Diagnosis: same  Procedure: Examination under anesthesia, Hysteroscopy, dilation and curettage  Surgeon: Dr Sumner Boast  Assistants: None  Anesthesia: General via LMA  EBL: 5 cc  Fluids: 1,000 cc LR  Fluid deficit: 165 cc  Urine output: not recorded  Indications for surgery: The patient is a 41 yo female, who presented for evaluation of a large fibroid uterus that was discovered by her primary, Dr Betty Martinique. The patient c/o frequent urination and abnormal uterine bleeding. She denied abdominal or pelvic pain. Ultrasound showed a 10 x 3.7 x 4.3 cm uterus with a 9.7 x 8 x 13.4 cm pedunculated fibroid. She had a normal CBC, TSH and pap smear.  The risks of the surgery were reviewed with the patient and the consent form was signed prior to her surgery.  Findings: EUA: a very large, mobile fibroid uterus, on the left side it extends to a few cm below her rib cage, on the right side it is at the level of her umbilicus. Hysteroscopy: polypoid appearing endometrium anteriorly, otherwise normal endometrial cavity with normal tubal ostia bilaterally.   Specimens: Polypoid endometrium, endometrial curettings   Procedure: The patient was taken to the operating room with an IV in place. She was placed in the dorsal lithotomy position and anesthesia was administered. She was prepped and draped in the usual sterile fashion for a vaginal procedure. She voided on the way to the OR. A sims retractor was placed in the vagina and a single tooth tenaculum was placed on the anterior lip of the cervix. The cervix was dilated to a #7 hagar dilator. The uterus was sounded to 9 cm. The myosure hysteroscope was inserted into the uterine cavity. With continuous infusion of normal saline, the uterine cavity was visualized with the above findings. The myosure reach was used to remove the polypoid endometrium. The myosure was then  removed. The cavity was then curetted with the small sharp curette. The cavity had the characteristically gritty texture at the end of the procedure. The curette and the single tooth tenaculum were removed. The vaginal retractor was removed. The patients perineum was cleansed of betadine and she was taken out of the dorsal lithotomy position.  Upon awakening the LMA was removed and the patient was transferred to the recovery room in stable and awake condition.  The sponge and instrument count were correct. There were no complications.    CC: Dr Betty Martinique

## 2016-10-08 NOTE — Anesthesia Postprocedure Evaluation (Signed)
Anesthesia Post Note  Patient: Catherine Dawn Konen  Procedure(s) Performed: Procedure(s) (LRB): DILATATION & CURETTAGE/HYSTEROSCOPY WITH MYOSURE (N/A)     Patient location during evaluation: PACU Anesthesia Type: General Level of consciousness: awake Pain management: pain level controlled Vital Signs Assessment: post-procedure vital signs reviewed and stable Respiratory status: spontaneous breathing Cardiovascular status: stable Postop Assessment: no signs of nausea or vomiting Anesthetic complications: no    Last Vitals:  Vitals:   10/08/16 1200 10/08/16 1215  BP: 127/87 101/85  Pulse: 70 66  Resp: 19 14  Temp:      Last Pain:  Vitals:   10/08/16 1215  TempSrc:   PainSc: 0-No pain   Pain Goal: Patients Stated Pain Goal: 4 (10/08/16 0959)               [name] JR,[name]

## 2016-10-08 NOTE — Anesthesia Preprocedure Evaluation (Signed)
Anesthesia Evaluation  Patient identified by MRN, date of birth, ID band Patient awake    Reviewed: Allergy & Precautions, NPO status , Patient's Chart, lab work & pertinent test results  Airway Mallampati: II  TM Distance: >3 FB Neck ROM: Full    Dental no notable dental hx.    Pulmonary neg pulmonary ROS,    Pulmonary exam normal breath sounds clear to auscultation       Cardiovascular negative cardio ROS Normal cardiovascular exam Rhythm:Regular Rate:Normal     Neuro/Psych Mentally challenged negative neurological ROS  negative psych ROS   GI/Hepatic negative GI ROS, Neg liver ROS,   Endo/Other  negative endocrine ROS  Renal/GU negative Renal ROS  negative genitourinary   Musculoskeletal negative musculoskeletal ROS (+)   Abdominal   Peds negative pediatric ROS (+)  Hematology negative hematology ROS (+)   Anesthesia Other Findings   Reproductive/Obstetrics negative OB ROS                             Anesthesia Physical Anesthesia Plan  ASA: II  Anesthesia Plan: General   Post-op Pain Management:    Induction: Intravenous  PONV Risk Score and Plan: 1 and Ondansetron, Dexamethasone and Propofol  Airway Management Planned: LMA  Additional Equipment:   Intra-op Plan:   Post-operative Plan: Extubation in OR  Informed Consent: I have reviewed the patients History and Physical, chart, labs and discussed the procedure including the risks, benefits and alternatives for the proposed anesthesia with the patient or authorized representative who has indicated his/her understanding and acceptance.   Dental advisory given  Plan Discussed with: CRNA and Surgeon  Anesthesia Plan Comments:         Anesthesia Quick Evaluation

## 2016-10-08 NOTE — Discharge Instructions (Signed)
DISCHARGE INSTRUCTIONS: D&C / D&E °The following instructions have been prepared to help you care for yourself upon your return home. °  °Personal hygiene: °• Use sanitary pads for vaginal drainage, not tampons. °• Shower the day after your procedure. °• NO tub baths, pools or Jacuzzis for 2-3 weeks. °• Wipe front to back after using the bathroom. ° °Activity and limitations: °• Do NOT drive or operate any equipment for 24 hours. The effects of anesthesia are still present and drowsiness may result. °• Do NOT rest in bed all day. °• Walking is encouraged. °• Walk up and down stairs slowly. °• You may resume your normal activity in one to two days or as indicated by your physician. ° °Sexual activity: NO intercourse for at least 2 weeks after the procedure, or as indicated by your physician. ° °Diet: Eat a light meal as desired this evening. You may resume your usual diet tomorrow. ° °Return to work: You may resume your work activities in one to two days or as indicated by your doctor. ° °What to expect after your surgery: Expect to have vaginal bleeding/discharge for 2-3 days and spotting for up to 10 days. It is not unusual to have soreness for up to 1-2 weeks. You may have a slight burning sensation when you urinate for the first day. Mild cramps may continue for a couple of days. You may have a regular period in 2-6 weeks. ° °Call your doctor for any of the following: °• Excessive vaginal bleeding, saturating and changing one pad every hour. °• Inability to urinate 6 hours after discharge from hospital. °• Pain not relieved by pain medication. °• Fever of 100.4° F or greater. °• Unusual vaginal discharge or odor. ° ° Call for an appointment:  ° ° °Patient’s signature: ______________________ ° °Nurse’s signature ________________________ ° °Support person's signature_______________________ ° ° °DISCHARGE INSTRUCTIONS: D&C / D&E °The following instructions have been prepared to help you care for yourself upon your  return home. °  °Personal hygiene: °• Use sanitary pads for vaginal drainage, not tampons. °• Shower the day after your procedure. °• NO tub baths, pools or Jacuzzis for 2-3 weeks. °• Wipe front to back after using the bathroom. ° °Activity and limitations: °• Do NOT drive or operate any equipment for 24 hours. The effects of anesthesia are still present and drowsiness may result. °• Do NOT rest in bed all day. °• Walking is encouraged. °• Walk up and down stairs slowly. °• You may resume your normal activity in one to two days or as indicated by your physician. ° °Sexual activity: NO intercourse for at least 2 weeks after the procedure, or as indicated by your physician. ° °Diet: Eat a light meal as desired this evening. You may resume your usual diet tomorrow. ° °Return to work: You may resume your work activities in one to two days or as indicated by your doctor. ° °What to expect after your surgery: Expect to have vaginal bleeding/discharge for 2-3 days and spotting for up to 10 days. It is not unusual to have soreness for up to 1-2 weeks. You may have a slight burning sensation when you urinate for the first day. Mild cramps may continue for a couple of days. You may have a regular period in 2-6 weeks. ° °Call your doctor for any of the following: °• Excessive vaginal bleeding, saturating and changing one pad every hour. °• Inability to urinate 6 hours after discharge from hospital. °• Pain not relieved by   pain medication.  Fever of 100.4 F or greater.  Unusual vaginal discharge or odor.   Call for an appointment:    Patients signature: ______________________  Nurses signature ________________________  Support person's signature_______________________   Can take ibuprofen/motrin /advi lat 5:30PM Drink more water next 48hours Can have warm heating pad to abdomen

## 2016-10-08 NOTE — Anesthesia Procedure Notes (Signed)
Procedure Name: LMA Insertion Date/Time: 10/08/2016 11:10 AM Performed by: Bufford Spikes Pre-anesthesia Checklist: Patient identified, Emergency Drugs available, Suction available and Patient being monitored Patient Re-evaluated:Patient Re-evaluated prior to induction Oxygen Delivery Method: Circle system utilized Preoxygenation: Pre-oxygenation with 100% oxygen Induction Type: IV induction Ventilation: Mask ventilation without difficulty LMA: LMA inserted LMA Size: 4.0 and 3.0 Number of attempts: 1 Placement Confirmation: positive ETCO2 Tube secured with: Tape Dental Injury: Teeth and Oropharynx as per pre-operative assessment

## 2016-10-09 ENCOUNTER — Encounter (HOSPITAL_COMMUNITY): Payer: Self-pay | Admitting: Obstetrics and Gynecology

## 2016-10-10 ENCOUNTER — Telehealth: Payer: Self-pay

## 2016-10-10 NOTE — Telephone Encounter (Signed)
Spoke with patient's aunt Letta Median, okay per ROI. Advised of results as seen below from [place]. [name] verbalizes understanding. Reports the patient is doing well and has no concerns at this time.  Routing to provider for final review. Patient agreeable to disposition. Will close encounter.

## 2016-10-10 NOTE — Telephone Encounter (Signed)
-----   Message from Salvadore Dom, MD sent at 10/09/2016  7:19 PM EDT ----- Please call and speak to the patient's Aunt. Let her know that the pathology was benign. She did have endometrial polyps. Removing the polyps should help with her constant bleeding. Please make sure the patient is feeling well. We can further discuss a plan at her post op visit.

## 2016-10-24 NOTE — Progress Notes (Deleted)
GYNECOLOGY  VISIT   HPI: 41 y.o.   Single  {Race/ethnicity:17218}  female   [id number] with No LMP recorded. Patient is not currently having periods (Reason: Irregular Periods).   here for     GYNECOLOGIC HISTORY: No LMP recorded. Patient is not currently having periods (Reason: Irregular Periods). Contraception:*** Menopausal hormone therapy: ***        OB History    Gravida Para Term Preterm AB Living   0 0 0 0 0 0   SAB TAB Ectopic Multiple Live Births   0 0 0 0 0         Patient Active Problem List   Diagnosis Date Noted  . Dysfunctional uterine bleeding 10/15/2013  . BMI 34.0-34.9,adult 05/15/2006  . Intellectual disability 05/15/2006  . RHINITIS, ALLERGIC 05/15/2006  . ASTHMA, EXERCISE INDUCED 05/15/2006    Past Medical History:  Diagnosis Date  . Asthma    8-10 years since having issues with asthma  . Fibroid   . Mentally challenged     Past Surgical History:  Procedure Laterality Date  . DILATATION & CURETTAGE/HYSTEROSCOPY WITH MYOSURE N/A 10/08/2016   Procedure: DILATATION & CURETTAGE/HYSTEROSCOPY WITH MYOSURE;  Surgeon: Salvadore Dom, MD;  Location: [facility];  Service: Gynecology;  Laterality: N/A;  rep will be here confirmed 10/01/16.  Marland Kitchen HERNIA REPAIR     Umbilical, per aunt faye she is unaware of this surgery  . none    . TONSILLECTOMY      No current outpatient prescriptions on file.   No current facility-administered medications for this visit.      ALLERGIES: Patient has no known allergies.  Family History  Problem Relation Age of Onset  . Cancer Mother        breast  . Heart disease Father   . Hypertension Father   . Heart attack Father   . Hypertension Maternal Aunt   . Kidney disease Maternal Aunt        dialysis  . Thyroid cancer Maternal Grandmother   . Stroke Maternal Grandfather   . Kidney failure Maternal Aunt   . Thyroid cancer Maternal Aunt     Social History   Social History  . Marital status: Single    Spouse name:  N/A  . Number of children: N/A  . Years of education: N/A   Occupational History  . Not on file.   Social History Main Topics  . Smoking status: Never Smoker  . Smokeless tobacco: Never Used  . Alcohol use No  . Drug use: No  . Sexual activity: No   Other Topics Concern  . Not on file   Social History Narrative  . No narrative on file    ROS  PHYSICAL EXAMINATION:    There were no vitals taken for this visit.    General appearance: alert, cooperative and appears stated age Neck: no adenopathy, supple, symmetrical, trachea midline and thyroid {CHL AMB PHY EX THYROID NORM DEFAULT:[id number]::"normal to inspection and palpation"} Breasts: {Exam; breast:13139::"normal appearance, no masses or tenderness"} Abdomen: soft, non-tender; bowel sounds normal; no masses,  no organomegaly  Pelvic: External genitalia:  no lesions              Urethra:  normal appearing urethra with no masses, tenderness or lesions              Bartholins and Skenes: normal                 Vagina: normal appearing vagina with normal  color and discharge, no lesions              Cervix: {CHL AMB PHY EX CERVIX NORM DEFAULT:[phone]::"no lesions"}              Bimanual Exam:  Uterus:  {CHL AMB PHY EX UTERUS NORM DEFAULT:[phone]::"normal size, contour, position, consistency, mobility, non-tender"}              Adnexa: {CHL AMB PHY EX ADNEXA NO MASS DEFAULT:[phone]::"no mass, fullness, tenderness"}              Rectovaginal: {yes no:314532}.  Confirms.              Anus:  normal sphincter tone, no lesions  Chaperone was present for exam.  ASSESSMENT     PLAN    An After Visit Summary was printed and given to the patient.  *** minutes face to face time of which over 50% was spent in counseling.

## 2016-10-28 ENCOUNTER — Ambulatory Visit: Payer: Self-pay | Admitting: Obstetrics and Gynecology

## 2016-10-28 ENCOUNTER — Telehealth: Payer: Self-pay | Admitting: Obstetrics and Gynecology

## 2016-10-28 NOTE — Telephone Encounter (Signed)
[facility] her 2 week post op appointment today. I left a message for the patient's guardian Letta Median to call and reschedule.

## 2016-10-29 NOTE — Telephone Encounter (Signed)
Patient came in thinking post op appointment was scheduled for today and not yesterday. Guardian says she is doing fine and could not come back in when Dr. Talbert Nan had an appointment available. Will wait to hear back from nurse about scheduling.

## 2016-10-29 NOTE — Telephone Encounter (Signed)
Left message to call Tajh Livsey at [phone].  

## 2016-10-31 NOTE — Telephone Encounter (Signed)
Spoke with Guardian "Letta Median", ok per current dpr. Patient rescheduled for 11/05/16 at 10am with Dr. Nelson Chimes. Letta Median agreeable to date and time.  Routing to provider for final review. Patient is agreeable to disposition. Will close encounter.

## 2016-11-05 ENCOUNTER — Ambulatory Visit (INDEPENDENT_AMBULATORY_CARE_PROVIDER_SITE_OTHER): Payer: PPO | Admitting: Obstetrics and Gynecology

## 2016-11-05 ENCOUNTER — Encounter: Payer: Self-pay | Admitting: Obstetrics and Gynecology

## 2016-11-05 VITALS — BP 112/70 | HR 72 | Resp 16 | Wt 163.0 lb

## 2016-11-05 DIAGNOSIS — D259 Leiomyoma of uterus, unspecified: Secondary | ICD-10-CM

## 2016-11-05 DIAGNOSIS — N939 Abnormal uterine and vaginal bleeding, unspecified: Secondary | ICD-10-CM | POA: Diagnosis not present

## 2016-11-05 DIAGNOSIS — Z9889 Other specified postprocedural states: Secondary | ICD-10-CM

## 2016-11-05 NOTE — Progress Notes (Signed)
GYNECOLOGY  VISIT   HPI: 41 y.o.   Single  African American  female   [place] with Patient's last menstrual period was 10/16/2016.   here for follow up. Patient is 3 weeks S/P D&C with myosure with removal of benign polyps. She had some bleeding for a while after surgery, not currently bleeding (poor historian). She denies any pain.  She has a large fibroid uterus, 13 cm fundal fibroid. She has no real c/o from the fibroid other than frequent urination. No deviation of her endometrial cavity with the fibroid, uterine cavity sounded to 9 cm at the time of her surgery.   GYNECOLOGIC HISTORY: Patient's last menstrual period was 10/16/2016. Contraception:none Menopausal hormone therapy: none         OB History    Gravida Para Term Preterm AB Living   0 0 0 0 0 0   SAB TAB Ectopic Multiple Live Births   0 0 0 0 0         Patient Active Problem List   Diagnosis Date Noted  . Dysfunctional uterine bleeding 10/15/2013  . BMI 34.0-34.9,adult 05/15/2006  . Intellectual disability 05/15/2006  . RHINITIS, ALLERGIC 05/15/2006  . ASTHMA, EXERCISE INDUCED 05/15/2006    Past Medical History:  Diagnosis Date  . Asthma    8-10 years since having issues with asthma  . Fibroid   . Mentally challenged     Past Surgical History:  Procedure Laterality Date  . DILATATION & CURETTAGE/HYSTEROSCOPY WITH MYOSURE N/A 10/08/2016   Procedure: DILATATION & CURETTAGE/HYSTEROSCOPY WITH MYOSURE;  Surgeon: Salvadore Dom, MD;  Location: [facility];  Service: Gynecology;  Laterality: N/A;  rep will be here confirmed 10/01/16.  Marland Kitchen DILATION AND CURETTAGE OF UTERUS    . HERNIA REPAIR     Umbilical, per aunt faye she is unaware of this surgery  . none    . TONSILLECTOMY      No current outpatient prescriptions on file.   No current facility-administered medications for this visit.      ALLERGIES: Patient has no known allergies.  Family History  Problem Relation Age of Onset  . Cancer Mother    breast  . Heart disease Father   . Hypertension Father   . Heart attack Father   . Hypertension Maternal Aunt   . Kidney disease Maternal Aunt        dialysis  . Thyroid cancer Maternal Grandmother   . Stroke Maternal Grandfather   . Kidney failure Maternal Aunt   . Thyroid cancer Maternal Aunt     Social History   Social History  . Marital status: Single    Spouse name: N/A  . Number of children: N/A  . Years of education: N/A   Occupational History  . Not on file.   Social History Main Topics  . Smoking status: Never Smoker  . Smokeless tobacco: Never Used  . Alcohol use No  . Drug use: No  . Sexual activity: No   Other Topics Concern  . Not on file   Social History Narrative  . No narrative on file    Review of Systems  Constitutional: Negative.   HENT: Negative.   Eyes: Negative.   Respiratory: Negative.   Cardiovascular: Negative.   Gastrointestinal: Negative.   Genitourinary: Negative.   Musculoskeletal: Negative.   Skin: Negative.   Neurological: Negative.   Endo/Heme/Allergies: Negative.   Psychiatric/Behavioral: Negative.     PHYSICAL EXAMINATION:    BP 112/70 (BP Location: Left Arm, Patient  Position: Sitting, Cuff Size: Normal)   Pulse 72   Resp 16   Wt 163 lb (73.9 kg)   LMP 10/16/2016   BMI 34.36 kg/m     General appearance: alert, cooperative and appears stated age  ASSESSMENT H/O AUB, s/p hysteroscopy with resection of polypoid endometrium. Path with benign polyps Large fibroid uterus, minimal symptoms, fibroids not deviating her uterine cavity    PLAN Discussed with the patient's Aunt's the option of OCP's (she hasn't been able to take in the past), depo-provera (with associated symptoms), or calendaring her cycles Discussed that the fibroid doesn't deviate her cavity and I don't think it is contributing to her abnormal bleeding Given her minimal symptoms from the fibroid, I wouldn't recommend surgery at this time. Discussed that  if she did have surgery, she would be at risk of needing a laparotomy. Would pre-treat with lupron She will calendar her bleeding and return in 12/18 for f/u and a repeat ultrasound. I want to make sure the fibroid isn't rapidly enlarging.    An After Visit Summary was printed and given to the patient.  15 minutes face to face time of which over 50% was spent in counseling.

## 2016-12-24 NOTE — Progress Notes (Signed)
HPI:   Catherine Hays is a 41 y.o. female, who is here today with her aunt requesting [employer] documentation to be completed.   She was seen on 05/23/16 for her routine CPE.  Since her last OV she has followed with gyn, Dr Talbert Nan, for DUB. In 09/2016 she underwent D&C and hysteroscopy. Menstrual flow has decreased and not having dysmenorrhea.  Lab Results  Component Value Date   WBC 4.0 08/06/2016   HGB 12.5 08/06/2016   HCT 37.8 08/06/2016   MCV 85.4 08/06/2016   PLT 253.0 08/06/2016     She has Hx of MR, constipation,allergic rhinitis. She is still working part time and uses [employer] for transportation. She does not have physical limitations but has difficulty following complex directions. She needs transportation mainly to be able to go to work.  She has no concerns today.   Review of Systems  Constitutional: Negative for activity change, appetite change, fatigue and fever.  HENT: Negative for mouth sores, nosebleeds and trouble swallowing.   Eyes: Negative for redness and visual disturbance.  Respiratory: Negative for cough, shortness of breath and wheezing.   Cardiovascular: Negative for chest pain, palpitations and leg swelling.  Gastrointestinal: Negative for abdominal pain, nausea and vomiting.       Negative for changes in bowel habits.  Genitourinary: Negative for decreased urine volume, dysuria and hematuria.  Neurological: Negative for seizures, syncope, weakness and headaches.  Psychiatric/Behavioral: Negative for sleep disturbance. The patient is not nervous/anxious.       No current outpatient prescriptions on file prior to visit.   No current facility-administered medications on file prior to visit.      Past Medical History:  Diagnosis Date  . Asthma    8-10 years since having issues with asthma  . Fibroid   . Mentally challenged    No Known Allergies  Social History   Social History  . Marital status: Single    Spouse name: N/A    . Number of children: N/A  . Years of education: N/A   Social History Main Topics  . Smoking status: Never Smoker  . Smokeless tobacco: Never Used  . Alcohol use No  . Drug use: No  . Sexual activity: No   Other Topics Concern  . None   Social History Narrative  . None    Vitals:   12/25/16 0917  BP: 120/75  Pulse: 80  Resp: 12   Body mass index is 33.86 kg/m.   Physical Exam  Nursing note and vitals reviewed. Constitutional: She appears well-developed. No distress.  HENT:  Head: Normocephalic and atraumatic.  Eyes: Conjunctivae are normal.  Cardiovascular: Normal rate and regular rhythm.   No murmur heard. Respiratory: Effort normal and breath sounds normal. No respiratory distress.  GI: Soft. There is no tenderness.  Musculoskeletal: She exhibits no edema or tenderness.  Neurological: She is alert. She has normal strength. Coordination and gait normal.  Skin: Skin is warm. No erythema.  Psychiatric: She has a normal mood and affect. Cognition and memory are impaired. She is noncommunicative.  Fairly groomed, good eye contact. Non verbal today.    ASSESSMENT AND PLAN:   Catherine Hays was seen today for scat form.  Diagnoses and all orders for this visit:  Intellectual disability  Because cognitive impairment she needs assistance with transportation.  She is not able to drive but can follow simple directions, so [employer] service to be able to get to her job is very  appropriate.  Form completed and will be faxed.  Dysfunctional uterine bleeding  Improved. She will continue following with gyn.  Need for influenza vaccination -     Flu Vaccine QUAD 36+ mos IM    Verina Galeno G. Martinique, MD  [facility]. [employer] office.

## 2016-12-25 ENCOUNTER — Ambulatory Visit (INDEPENDENT_AMBULATORY_CARE_PROVIDER_SITE_OTHER): Payer: PPO | Admitting: Family Medicine

## 2016-12-25 ENCOUNTER — Encounter: Payer: Self-pay | Admitting: Family Medicine

## 2016-12-25 VITALS — BP 120/75 | HR 80 | Resp 12 | Ht <= 58 in | Wt 162.0 lb

## 2016-12-25 DIAGNOSIS — F79 Unspecified intellectual disabilities: Secondary | ICD-10-CM

## 2016-12-25 DIAGNOSIS — Z23 Encounter for immunization: Secondary | ICD-10-CM

## 2016-12-25 DIAGNOSIS — N938 Other specified abnormal uterine and vaginal bleeding: Secondary | ICD-10-CM

## 2016-12-25 NOTE — Patient Instructions (Signed)
A few things to remember from today's visit:   Need for influenza vaccination - Plan: Flu Vaccine QUAD 36+ mos IM  Intellectual disability  Dysfunctional uterine bleeding   Please be sure medication list is accurate. If a new problem present, please set up appointment sooner than planned today.

## 2017-01-31 ENCOUNTER — Telehealth: Payer: Self-pay | Admitting: *Deleted

## 2017-01-31 NOTE — Telephone Encounter (Signed)
Fax confirmation received. 

## 2017-01-31 NOTE — Telephone Encounter (Signed)
Caller: Kennyth Lose from [facility]  Reason for call: Kennyth Lose is missing page 4 of Part A and page 2 of Part B of patient's SCAT application. I re-faxed page 2 of Part B to [phone] but do not see the other page in Media. Kennyth Lose stated she could just give the other page to patient next time she sees her. No further needs at time of call.

## 2017-02-10 ENCOUNTER — Telehealth: Payer: Self-pay | Admitting: Family Medicine

## 2017-02-10 NOTE — Telephone Encounter (Signed)
Copied from [place]. Topic: General - Other >> Feb 10, 2017  3:43 PM Carolyn Stare wrote: Reason for CRM: pt guardian call to say paperwork that was filled out by Dr Martinique and sent to the [facility] did not have the pt name on  2nd sheet part b   [name]  phone number [phone] pt guardian said that paper need to be refaxed  Caryl Bis name on the sheet. Req that part be filled out and faxed back to  [facility] at [phone]

## 2017-02-11 ENCOUNTER — Telehealth: Payer: Self-pay | Admitting: Obstetrics and Gynecology

## 2017-02-11 NOTE — Telephone Encounter (Signed)
It is ok to do ad name to the form and re-fax it. Thanks, BJ

## 2017-02-11 NOTE — Telephone Encounter (Signed)
Spoke with patients guardian, Catherine Hays (listed on DPR) to schedule follow up ultrasound. Reviewed benefit for ultrasound. Ms Catherine Hays understood and agreeable. Patient scheduled 02/25/17 with Dr Talbert Nan. Ms Catherine Hays is aware of appointment date, arrival time and cancellation policy.   Ms Catherine Hays mentioned in conversation that the patient seems to be going to the bathroom more than usual, but states patient is not hurting and her periods seem to be normal, just urinating frequently.  When I advised Ms Catherine Hays, I would send a message to her doctor, she said "no", she "would just call if anything changed".   Forwarding to Dr Talbert Nan for final review

## 2017-02-14 NOTE — Telephone Encounter (Signed)
Copied from [place]. Topic: General - Other >> Feb 14, 2017 10:05 AM Darl Householder, RMA wrote: Patient's guardian [name] is calling to see if paperwork has been faxed back this is her 2nd time calling please see CRM on 02/10/2017

## 2017-02-14 NOTE — Telephone Encounter (Signed)
Spoke with [name], letter her know that on part B where her name is missing is where the patient's signature goes. Letta Median verbalized understanding and said she would be in Monday morning to pick up paperwork to sign and take to [facility] herself. Informed Letta Median that paperwork would be at the front desk.

## 2017-02-14 NOTE — Telephone Encounter (Signed)
Please print form, add patient's name and fax it back. Thanks, BJ

## 2017-02-17 ENCOUNTER — Telehealth: Payer: Self-pay | Admitting: Family Medicine

## 2017-02-17 NOTE — Telephone Encounter (Signed)
Copied from [place]. Topic: General - Other >> Feb 17, 2017 12:08 PM Cecelia Byars, NT wrote: Reason for CRM: Records were faxed to [facility] and the information faxed  was missing pages and information and  they would like to know if this can be scanned instead of faxed please scan to  [email or web address] Please call [place],

## 2017-02-25 ENCOUNTER — Ambulatory Visit (INDEPENDENT_AMBULATORY_CARE_PROVIDER_SITE_OTHER): Payer: PPO

## 2017-02-25 ENCOUNTER — Other Ambulatory Visit: Payer: Self-pay

## 2017-02-25 ENCOUNTER — Encounter: Payer: Self-pay | Admitting: Obstetrics and Gynecology

## 2017-02-25 ENCOUNTER — Ambulatory Visit: Payer: PPO | Admitting: Obstetrics and Gynecology

## 2017-02-25 VITALS — BP 110/80 | HR 76 | Resp 18 | Wt 158.0 lb

## 2017-02-25 DIAGNOSIS — N939 Abnormal uterine and vaginal bleeding, unspecified: Secondary | ICD-10-CM

## 2017-02-25 DIAGNOSIS — D259 Leiomyoma of uterus, unspecified: Secondary | ICD-10-CM

## 2017-02-25 DIAGNOSIS — N946 Dysmenorrhea, unspecified: Secondary | ICD-10-CM

## 2017-02-25 NOTE — Progress Notes (Signed)
GYNECOLOGY  VISIT   HPI: 41 y.o.   Single  African American  female   [place] with Patient's last menstrual period was 02/20/2017.   here for follow up irregular bleeding   She recently started having pain with her cycles, cramping. Cycles q month x 7 days. Changing a pad every 2-3 hours, not full.  She voids frequently, no change. Nocturia 2-3 x a night. Mostly normal BM. Some abdominal pain in between her cycle.   GYNECOLOGIC HISTORY: Patient's last menstrual period was 02/20/2017. Contraception:none  Menopausal hormone therapy: none         OB History    Gravida Para Term Preterm AB Living   0 0 0 0 0 0   SAB TAB Ectopic Multiple Live Births   0 0 0 0 0         Patient Active Problem List   Diagnosis Date Noted  . Dysfunctional uterine bleeding 10/15/2013  . BMI 34.0-34.9,adult 05/15/2006  . Intellectual disability 05/15/2006  . RHINITIS, ALLERGIC 05/15/2006  . ASTHMA, EXERCISE INDUCED 05/15/2006    Past Medical History:  Diagnosis Date  . Asthma    8-10 years since having issues with asthma  . Fibroid   . Mentally challenged     Past Surgical History:  Procedure Laterality Date  . DILATATION & CURETTAGE/HYSTEROSCOPY WITH MYOSURE N/A 10/08/2016   Procedure: DILATATION & CURETTAGE/HYSTEROSCOPY WITH MYOSURE;  Surgeon: Salvadore Dom, MD;  Location: [facility];  Service: Gynecology;  Laterality: N/A;  rep will be here confirmed 10/01/16.  Marland Kitchen DILATION AND CURETTAGE OF UTERUS    . HERNIA REPAIR     Umbilical, per aunt faye she is unaware of this surgery  . none    . TONSILLECTOMY      No current outpatient medications on file.   No current facility-administered medications for this visit.      ALLERGIES: Patient has no known allergies.  Family History  Problem Relation Age of Onset  . Cancer Mother        breast  . Heart disease Father   . Hypertension Father   . Heart attack Father   . Hypertension Maternal Aunt   . Kidney disease Maternal Aunt    dialysis  . Thyroid cancer Maternal Grandmother   . Stroke Maternal Grandfather   . Kidney failure Maternal Aunt   . Thyroid cancer Maternal Aunt     Social History   Socioeconomic History  . Marital status: Single    Spouse name: Not on file  . Number of children: Not on file  . Years of education: Not on file  . Highest education level: Not on file  Social Needs  . Financial resource strain: Not on file  . Food insecurity - worry: Not on file  . Food insecurity - inability: Not on file  . Transportation needs - medical: Not on file  . Transportation needs - non-medical: Not on file  Occupational History  . Not on file  Tobacco Use  . Smoking status: Never Smoker  . Smokeless tobacco: Never Used  Substance and Sexual Activity  . Alcohol use: No  . Drug use: No  . Sexual activity: No  Other Topics Concern  . Not on file  Social History Narrative  . Not on file    Review of Systems  Constitutional: Negative.   HENT: Negative.   Eyes: Negative.   Respiratory: Negative.   Cardiovascular: Negative.   Gastrointestinal: Negative.   Genitourinary: Negative.   Musculoskeletal:  Negative.   Skin: Negative.   Neurological: Negative.   Endo/Heme/Allergies: Negative.   Psychiatric/Behavioral: Negative.     PHYSICAL EXAMINATION:    BP 110/80 (BP Location: Right Arm, Patient Position: Sitting, Cuff Size: Normal)   Pulse 76   Resp 18   Wt 158 lb (71.7 kg)   LMP 02/20/2017   BMI 33.02 kg/m     General appearance: alert, cooperative and appears stated age Abdomen: today I was able to examine her. Previously she was tensing her abdomen. Her fibroid uterus is palpated just under her rib cage on the left and  3 cm above her umbilicus on the right. This is an enlargement from her prior exam.  Ultrasound: Fibroid uterus, one large fibroid off of her fundus that measures 17.8 x 8.8 x 15.4 cm (Up from 9.7 x 8 x 13.4 cm)  ASSESSMENT Enlarging fibroid uterus, increasing  symptoms    PLAN Recommend lupron (3 month shot). Discussed the option of myomectomy vs hysterectomy. In this patient, I would recommend hysterectomy. She is not going to have children, bothered by her cycles and given the size of the fibroid, the incisions and recovery would be the same.  Patient's aunt agrees. Plan lupron, then TLH, BS, cystoscopy 3 months later.  Reviewed the procedure with the patient and her aunt.    An After Visit Summary was printed and given to the patient.  Over 15 minutes face to face time of which over 50% was spent in counseling.

## 2017-03-12 ENCOUNTER — Telehealth: Payer: Self-pay | Admitting: *Deleted

## 2017-03-12 ENCOUNTER — Telehealth: Payer: Self-pay | Admitting: Obstetrics and Gynecology

## 2017-03-12 NOTE — Telephone Encounter (Signed)
[facility] calling to speak with someone about a prescription that was denied. Phone number is [phone]

## 2017-03-12 NOTE — Telephone Encounter (Signed)
Spoke with [place] at [employer]. Was advised PA denied for Lupron, may submit appeal.

## 2017-03-12 NOTE — Telephone Encounter (Signed)
Appeal completed and to Dr. Talbert Nan to review.

## 2017-03-12 NOTE — Telephone Encounter (Signed)
Spoke with christy at [name]. Calling to confirm RX for Lupron recieved:  Lupron 11.25mg  injection q63mo, #1RF. Dr. Talbert Nan Read back and confirmed.   Routing to provider for final review.  Will close encounter.  Cc: Lamont Snowball, RN

## 2017-03-14 NOTE — Telephone Encounter (Signed)
Appeal faxed to [employer] at [phone].

## 2017-03-17 NOTE — Telephone Encounter (Signed)
Spoke with Tori at [employer], f/u with approval for Lupron. Was advised although approval received 03/16/17, update not received in their system, can take up to 48 hours. Was advised to return call, allow 48 hours for update.

## 2017-03-17 NOTE — Telephone Encounter (Signed)
Spoke with patients aunt, "Letta Median", ok per current dpr. Advised appeal for Lupron approved from 03/15/17 -03/14/18. Advised [facility] will be contacting patient directly to authorize shipment of Lupron to office.   Call with start of menses to schedule Nurse Visit for Lupron injection. LMP 02/20/17.   Aunt verbalizes understanding and is agreeable.

## 2017-03-19 NOTE — Telephone Encounter (Signed)
Patient's guardian "Catherine Hays" is returning a call to [employer].

## 2017-03-19 NOTE — Telephone Encounter (Signed)
Spoke with Jonelle Sidle at [facility] regarding Lupron. Appeal has not been received from the benefits department and updated with pharmacy. Was advised can take 24-48 hours, was advised will put [facility] on process. Aware to return call to office at [phone] with any additional questions.

## 2017-03-19 NOTE — Telephone Encounter (Signed)
Spoke with patients Aunt "Catherine Hays". Patient started cycle on 03/18/17, has not received call from pharmacy. Advised will f/u with pharmacy and return call, Aunt is agreeable.

## 2017-03-19 NOTE — Telephone Encounter (Signed)
Spoke with Aunt, advised as seen below per pharmacy. Aunt is agreeable to plan.

## 2017-03-20 NOTE — Telephone Encounter (Signed)
Left message to call Sharee Pimple at [phone].  F/u to confirm if [facility] has contacted patient to review benefits /authorize shipment of Lupron.

## 2017-03-21 NOTE — Telephone Encounter (Signed)
Left message to call Yolando Gillum at [phone].  

## 2017-03-21 NOTE — Telephone Encounter (Signed)
Spoke with patients Aunt, has not received call from [employer] regarding Lupron. Advised Aunt I will f/u with the pharmacy and return call with any updates.

## 2017-03-21 NOTE — Telephone Encounter (Signed)
Spoke with patients Aunt faye, she has spoken with [facility] regarding Lupron. With copay of $1200 states this will not be an option. Was provided recommendations and contacts by [facility] for possible assistance with medication. Aunt is going to f/u with recommendations, is aware to return call to office for any additional questions. Will update Dr. Talbert Nan and review with nursing supervisor, will return call with any additional update. Aunt is agreeable.   Routing to S. Orvan Seen, RN  Cc: Dr. Talbert Nan

## 2017-03-21 NOTE — Telephone Encounter (Signed)
Spoke with Diane at [facility], f/u with Lupron. Was advised pharmacy needs to review benefits with patient, $1200 copay, does not qualify for copay assistance. May have patient contact pharmacy to review benefit or wait for call.   Call returned to patients Catherine Hays. Aunt provided with contact information for [facility], she will call directly.

## 2017-03-21 NOTE — Telephone Encounter (Signed)
Spoke with Leandrew Koyanagi, advised reviewed with Dr. Talbert Nan. Lupron 3.75 injection q 30 days for 3 months recommended, sample provided by [facility]. Nurse visit scheduled for 03/25/17 at 10am for Lupron injection. Aunt declined earlier date offered. Aunt thankful, verbalizes understanding and is agreeable.   Routing to provider for final review. Patient is agreeable to disposition. Will close encounter.

## 2017-03-21 NOTE — Telephone Encounter (Signed)
I think the lupron would make her surgery easier, but if she can't get the lupron, I would still plan on TLH/BS/cystoscopy.

## 2017-03-21 NOTE — Telephone Encounter (Signed)
Patient returning call.

## 2017-03-25 ENCOUNTER — Other Ambulatory Visit: Payer: Self-pay

## 2017-03-25 ENCOUNTER — Ambulatory Visit: Payer: PPO

## 2017-03-25 VITALS — BP 118/72 | HR 72 | Resp 16 | Wt 157.0 lb

## 2017-03-25 DIAGNOSIS — D259 Leiomyoma of uterus, unspecified: Secondary | ICD-10-CM

## 2017-03-25 MED ORDER — LEUPROLIDE ACETATE 3.75 MG IM KIT
3.7500 mg | PACK | Freq: Once | INTRAMUSCULAR | Status: AC
  Administered 2017-03-25: 3.75 mg via INTRAMUSCULAR

## 2017-03-25 NOTE — Progress Notes (Signed)
42 y.o. Single African American female here for her first Depo Lupron injection.  Indication for injection:  Uterine leiomyoma  LMP:  03/18/17  Contraception:  abstinence  Lupron order:  3.75mg  IM x 1dosages.    Order to administer given by Dr. Talbert Nan on 03/12/17.  Patient tolerated injection well in [place].  Patient administered office sample. See telephone encounter 03/12/17.

## 2017-04-01 ENCOUNTER — Telehealth: Payer: Self-pay | Admitting: Obstetrics and Gynecology

## 2017-04-01 NOTE — Telephone Encounter (Signed)
Spoke with patients Aunt "Letta Median". Reports cycle has continued since receiving Lupron 1/8, asking if normal? Reports bleeding as spotting, denies pain.   Advised bleeding after Lupron injection is not uncommon, will expect menses to stop completely. Hot flashes, vaginal dryness and absent menses are common side effects of Lupron. Advised to continue to monitor, return call to office should bleeding become heavy or pain develop. Will review with Dr. Talbert Nan and return call with any additional recommendations. Aunt is agreeable and verbalizes understanding.   Dr. Talbert Nan -any additional recommendations?

## 2017-04-01 NOTE — Telephone Encounter (Signed)
Patient's guardian Catherine Hays calling to speak with nurse. Patient's cycle has been on for 2 weeks and she got the Lupron injection last week.

## 2017-04-02 NOTE — Telephone Encounter (Signed)
I agree, she should monitor her bleeding. Usually bleeding doesn't last longer than a month. We should call in aygestin as add back therapy for her so that she doesn't get bad hot flashes or night sweats. Please call in 5 mg a day, #30 with 2 refills. Please explain this to her aunt.

## 2017-04-02 NOTE — Telephone Encounter (Signed)
Left message to call Sharee Pimple at [phone].  Rx pended.

## 2017-04-07 MED ORDER — NORETHINDRONE ACETATE 5 MG PO TABS: 5.0000 mg | ORAL_TABLET | Freq: Every day | ORAL | 2 refills | Status: DC

## 2017-04-07 NOTE — Telephone Encounter (Signed)
Spoke with patients aunt "Letta Median", advised as seen below per Dr. Talbert Nan. RX for aygestin to verified pharmacy on file. Aunt reports bleeding stopped last week. Aunt verbalizes understanding and is agreeable.   Routing to provider for final review. Patient is agreeable to disposition. Will close encounter.

## 2017-04-07 NOTE — Telephone Encounter (Signed)
Patient's aunt, [name], returned Jill's call.

## 2017-04-23 ENCOUNTER — Telehealth: Payer: Self-pay | Admitting: *Deleted

## 2017-04-23 NOTE — Telephone Encounter (Signed)
Left message to call Sharee Pimple at [phone].  Schedule Lupron for 04/25/17. Needs to see triage after injection.

## 2017-04-23 NOTE — Telephone Encounter (Signed)
Patient's guardian, [name], return [place] call.

## 2017-04-23 NOTE — Telephone Encounter (Signed)
Spoke with Catherine Hays. Lupron injection scheduled for 04/28/2017 at 8:30 am. LMP was 1/1/209. Has not started menses. Contraception Is abstinence.  Advised will need to see a triage nurse for pre-op instructions at this appointment. Catherine Hays is agreeable.   Routing to [place] for review.

## 2017-04-28 ENCOUNTER — Ambulatory Visit (INDEPENDENT_AMBULATORY_CARE_PROVIDER_SITE_OTHER): Payer: PPO

## 2017-04-28 ENCOUNTER — Telehealth: Payer: Self-pay

## 2017-04-28 VITALS — BP 110/72 | HR 80 | Ht <= 58 in | Wt 158.0 lb

## 2017-04-28 DIAGNOSIS — D259 Leiomyoma of uterus, unspecified: Secondary | ICD-10-CM | POA: Diagnosis not present

## 2017-04-28 MED ORDER — LEUPROLIDE ACETATE 3.75 MG IM KIT
3.7500 mg | PACK | Freq: Once | INTRAMUSCULAR | Status: AC
  Administered 2017-04-28: 3.75 mg via INTRAMUSCULAR

## 2017-04-28 NOTE — Telephone Encounter (Signed)
Spoke with patient and guardian Catherine Hays while in office. Pre operative instruction sheet reviewed and given. Copy with patient's surgical information. 1 week pre op appointment scheduled for 06/04/2017 at 8:15 am with Dr.Jertson. 1 week post op scheduled for 06/26/2017 at 8:30 am with Dr.Jertson. 4 week post op scheduled for 07/17/2017 at 9 am with Dr.Jertson. 3rd lupron injection scheduled for 05/28/2017 at 8:30 am. Patient and guardian are agreeable to all dates and times and verbalizes understanding of surgical instruction sheet.  Routing to provider for final review. Patient agreeable to disposition. Will close encounter.

## 2017-04-28 NOTE — Progress Notes (Signed)
Patient is here for Depo Provera Injection Patient is within Depo Provera Calender Limits Yes Next Depo Due between: 1 month Last AEX: 09-13-16 AEX Scheduled: not scheduled  Patient is aware when next depo is due  Pt tolerated Injection well.  Routed to provider for review, encounter closed.

## 2017-05-05 ENCOUNTER — Telehealth: Payer: Self-pay | Admitting: Obstetrics and Gynecology

## 2017-05-05 NOTE — Telephone Encounter (Signed)
Call placed to patients legal guardian, Catherine Hays to review benefits for scheduled surgery. Left voicemail message requesting a return call

## 2017-05-06 NOTE — Telephone Encounter (Signed)
Patient's guardian Catherine Hays is returning a call to [place].

## 2017-05-06 NOTE — Telephone Encounter (Signed)
Spoke with patients guardian Catherine Hays. Reviewed benefit for scheduled surgery. Catherine Catherine Hays understood and agreeable. She is  aware this is professional benefit only and she will be by hospital for separate benefits. Catherine Hays had no additional questions regarding benefits. Ok to close encounter

## 2017-05-21 ENCOUNTER — Telehealth: Payer: Self-pay | Admitting: Obstetrics and Gynecology

## 2017-05-21 NOTE — Telephone Encounter (Signed)
Routing to Lamont Snowball, RN for assistance.

## 2017-05-21 NOTE — Telephone Encounter (Signed)
Return call to [place]. Left message to call back.

## 2017-05-21 NOTE — Telephone Encounter (Signed)
Ms. Catherine Hays, DPR on file to share PHI, called requesting assistance with coordinating her insurance benefits to set up home health care for [name] for six weeks after surgery. She said Ger will be out of work for six weeks after she has surgery on 06/16/17 and she'll need assistance during that time. She said the insurance company needs a letter of necessity.

## 2017-05-28 ENCOUNTER — Ambulatory Visit (INDEPENDENT_AMBULATORY_CARE_PROVIDER_SITE_OTHER): Payer: PPO

## 2017-05-28 VITALS — BP 118/86 | HR 80 | Ht <= 58 in | Wt 156.6 lb

## 2017-05-28 DIAGNOSIS — D259 Leiomyoma of uterus, unspecified: Secondary | ICD-10-CM | POA: Diagnosis not present

## 2017-05-28 MED ORDER — LEUPROLIDE ACETATE 3.75 MG IM KIT
3.7500 mg | PACK | Freq: Once | INTRAMUSCULAR | Status: AC
  Administered 2017-05-28: 3.75 mg via INTRAMUSCULAR

## 2017-05-28 NOTE — Progress Notes (Signed)
Patient here today for 3rd Depo Lupron 3.75mg  injection. This is given in RUOQ and was tolerated well by patient.  **Depo Lupron was a sample supplied to patient from [employer]**

## 2017-05-29 NOTE — Telephone Encounter (Signed)
Referral to [place].  Call to [facility] and update provided.   Routing to provider for final review. Will close encounter.

## 2017-05-29 NOTE — Telephone Encounter (Signed)
Call to [facility]. patient eligible for possible services. Will need referral to begin process.

## 2017-05-29 NOTE — Telephone Encounter (Signed)
Return call from [place], on behalf of patient. Catherine Hays states patient cannot be left at home alone.  She will be discharged on Tuesday following surgery and will need assistance/supervision at home during time she is out of work. Catherine Hays has to be out of home for dialysis and patient cannot be home unsupervised.    Discussed potential insurance issues for custodial care. This is determined by [employer]. [name] requests we attempt to order any assistance available.  Advised will check [facility] services first to see if this is an option as they may be able to provide insight. Then will check with Home Health.   Catherine Hays called back to [employer] provided custodial care and providers are [place] and [place].

## 2017-06-03 ENCOUNTER — Telehealth: Payer: Self-pay | Admitting: Obstetrics and Gynecology

## 2017-06-03 NOTE — Telephone Encounter (Deleted)
Following up to see if request has been made so that she is able to talk to the insurance company. Told that request had to be made from [facility] requesting services after surgery.

## 2017-06-04 ENCOUNTER — Encounter: Payer: Self-pay | Admitting: Obstetrics and Gynecology

## 2017-06-04 ENCOUNTER — Other Ambulatory Visit: Payer: Self-pay

## 2017-06-04 ENCOUNTER — Ambulatory Visit (INDEPENDENT_AMBULATORY_CARE_PROVIDER_SITE_OTHER): Payer: PPO | Admitting: Obstetrics and Gynecology

## 2017-06-04 VITALS — BP 110/60 | HR 84 | Resp 14 | Wt 154.0 lb

## 2017-06-04 DIAGNOSIS — D259 Leiomyoma of uterus, unspecified: Secondary | ICD-10-CM

## 2017-06-04 DIAGNOSIS — N946 Dysmenorrhea, unspecified: Secondary | ICD-10-CM

## 2017-06-04 NOTE — Progress Notes (Signed)
GYNECOLOGY  VISIT   HPI: 42 y.o.   Single  African American  female   [place] with Patient's last menstrual period was 05/29/2017.   here for pre op. Patient is scheduled for [facility] 06-16-17 for enlarging symptomatic fibroid uterus. She is s/p 3 months of lupron. No significant bleeding on the lupron.   GYNECOLOGIC HISTORY: Patient's last menstrual period was 05/29/2017. Contraception:none Menopausal hormone therapy: none         OB History    Gravida Para Term Preterm AB Living   0 0 0 0 0 0   SAB TAB Ectopic Multiple Live Births   0 0 0 0 0         Patient Active Problem List   Diagnosis Date Noted  . Dysfunctional uterine bleeding 10/15/2013  . BMI 34.0-34.9,adult 05/15/2006  . Intellectual disability 05/15/2006  . RHINITIS, ALLERGIC 05/15/2006  . ASTHMA, EXERCISE INDUCED 05/15/2006    Past Medical History:  Diagnosis Date  . Asthma    8-10 years since having issues with asthma  . Fibroid   . Mentally challenged     Past Surgical History:  Procedure Laterality Date  . DILATATION & CURETTAGE/HYSTEROSCOPY WITH MYOSURE N/A 10/08/2016   Procedure: DILATATION & CURETTAGE/HYSTEROSCOPY WITH MYOSURE;  Surgeon: Salvadore Dom, MD;  Location: [facility];  Service: Gynecology;  Laterality: N/A;  rep will be here confirmed 10/01/16.  Marland Kitchen DILATION AND CURETTAGE OF UTERUS    . HERNIA REPAIR     Umbilical, per aunt faye she is unaware of this surgery  . none    . TONSILLECTOMY      Current Outpatient Medications  Medication Sig Dispense Refill  . leuprolide (LUPRON) 3.75 MG injection Inject 3.75 mg into the muscle once.    . norethindrone (AYGESTIN) 5 MG tablet Take 1 tablet (5 mg total) by mouth daily. 30 tablet 2   No current facility-administered medications for this visit.      ALLERGIES: Patient has no known allergies.  Family History  Problem Relation Age of Onset  . Cancer Mother        breast  . Heart disease Father   . Hypertension Father   . Heart attack Father    . Hypertension Maternal Aunt   . Kidney disease Maternal Aunt        dialysis  . Thyroid cancer Maternal Grandmother   . Stroke Maternal Grandfather   . Kidney failure Maternal Aunt   . Thyroid cancer Maternal Aunt     Social History   Socioeconomic History  . Marital status: Single    Spouse name: Not on file  . Number of children: Not on file  . Years of education: Not on file  . Highest education level: Not on file  Social Needs  . Financial resource strain: Not on file  . Food insecurity - worry: Not on file  . Food insecurity - inability: Not on file  . Transportation needs - medical: Not on file  . Transportation needs - non-medical: Not on file  Occupational History  . Not on file  Tobacco Use  . Smoking status: Never Smoker  . Smokeless tobacco: Never Used  Substance and Sexual Activity  . Alcohol use: No  . Drug use: No  . Sexual activity: No  Other Topics Concern  . Not on file  Social History Narrative  . Not on file    Review of Systems  Constitutional: Negative.   HENT: Negative.   Eyes: Negative.  Respiratory: Negative.   Cardiovascular: Negative.   Gastrointestinal: Negative.   Genitourinary: Negative.   Musculoskeletal: Negative.   Skin: Negative.   Neurological: Negative.   Endo/Heme/Allergies: Negative.   Psychiatric/Behavioral: Negative.     PHYSICAL EXAMINATION:    BP 110/60 (BP Location: Right Arm, Patient Position: Sitting, Cuff Size: Normal)   Pulse 84   Resp 14   Wt 154 lb (69.9 kg)   LMP 05/29/2017   BMI 32.47 kg/m     General appearance: alert, cooperative and appears stated age Neck: no adenopathy, supple, symmetrical, trachea midline and thyroid normal to inspection and palpation Heart: regular rate and rhythm Lungs: CTAB Abdomen: soft, non-tender; very enlarged fibroid uterus (slightly difficult exam, the patient was not relaxing well). No significant decrease in size of the large fibroid, still up in her LUQ. She does  have a scar in the infraumbilical region from her prior hernia repair Extremities: normal, atraumatic, no cyanosis Skin: normal color, texture and turgor, no rashes or lesions Lymph: normal cervical supraclavicular and inguinal nodes Neurologic: grossly normal   ASSESSMENT Discussed total laparoscopic hysterectomy, bilateral salpingectomy and cystoscopy with the patient and her Aunt. Reviewed the risks of the procedure, including infection, bleeding, damage to bowel/badder/vessels/ureters.  Discussed the possible need for laparotomy. Discussed post operative recovery and risk of cuff dehiscence. All of her questions were answered     PLAN TLH/BS/Cystoscopy Will avoid umbilical incision given prior hernia repair Pre-op antibiotics and lovenox     An After Visit Summary was printed and given to the patient.

## 2017-06-04 NOTE — H&P (Signed)
GYNECOLOGY  VISIT   HPI: 42 y.o.   Single  African American  female   [place] with Patient's last menstrual period was 05/29/2017.   here for pre op. Patient is scheduled for [facility] 06-16-17 for enlarging symptomatic fibroid uterus. She is s/p 3 months of lupron. No significant bleeding on the lupron.   GYNECOLOGIC HISTORY: Patient's last menstrual period was 05/29/2017. Contraception:none Menopausal hormone therapy: none         OB History    Gravida Para Term Preterm AB Living   0 0 0 0 0 0   SAB TAB Ectopic Multiple Live Births   0 0 0 0 0         Patient Active Problem List   Diagnosis Date Noted  . Dysfunctional uterine bleeding 10/15/2013  . BMI 34.0-34.9,adult 05/15/2006  . Intellectual disability 05/15/2006  . RHINITIS, ALLERGIC 05/15/2006  . ASTHMA, EXERCISE INDUCED 05/15/2006    Past Medical History:  Diagnosis Date  . Asthma    8-10 years since having issues with asthma  . Fibroid   . Mentally challenged     Past Surgical History:  Procedure Laterality Date  . DILATATION & CURETTAGE/HYSTEROSCOPY WITH MYOSURE N/A 10/08/2016   Procedure: DILATATION & CURETTAGE/HYSTEROSCOPY WITH MYOSURE;  Surgeon: Salvadore Dom, MD;  Location: [facility];  Service: Gynecology;  Laterality: N/A;  rep will be here confirmed 10/01/16.  Marland Kitchen DILATION AND CURETTAGE OF UTERUS    . HERNIA REPAIR     Umbilical, per aunt faye she is unaware of this surgery  . none    . TONSILLECTOMY      Current Outpatient Medications  Medication Sig Dispense Refill  . leuprolide (LUPRON) 3.75 MG injection Inject 3.75 mg into the muscle once.    . norethindrone (AYGESTIN) 5 MG tablet Take 1 tablet (5 mg total) by mouth daily. 30 tablet 2   No current facility-administered medications for this visit.      ALLERGIES: Patient has no known allergies.  Family History  Problem Relation Age of Onset  . Cancer Mother        breast  . Heart disease Father   . Hypertension Father   . Heart attack  Father   . Hypertension Maternal Aunt   . Kidney disease Maternal Aunt        dialysis  . Thyroid cancer Maternal Grandmother   . Stroke Maternal Grandfather   . Kidney failure Maternal Aunt   . Thyroid cancer Maternal Aunt     Social History   Socioeconomic History  . Marital status: Single    Spouse name: Not on file  . Number of children: Not on file  . Years of education: Not on file  . Highest education level: Not on file  Social Needs  . Financial resource strain: Not on file  . Food insecurity - worry: Not on file  . Food insecurity - inability: Not on file  . Transportation needs - medical: Not on file  . Transportation needs - non-medical: Not on file  Occupational History  . Not on file  Tobacco Use  . Smoking status: Never Smoker  . Smokeless tobacco: Never Used  Substance and Sexual Activity  . Alcohol use: No  . Drug use: No  . Sexual activity: No  Other Topics Concern  . Not on file  Social History Narrative  . Not on file    Review of Systems  Constitutional: Negative.   HENT: Negative.   Eyes: Negative.  Respiratory: Negative.   Cardiovascular: Negative.   Gastrointestinal: Negative.   Genitourinary: Negative.   Musculoskeletal: Negative.   Skin: Negative.   Neurological: Negative.   Endo/Heme/Allergies: Negative.   Psychiatric/Behavioral: Negative.     PHYSICAL EXAMINATION:    BP 110/60 (BP Location: Right Arm, Patient Position: Sitting, Cuff Size: Normal)   Pulse 84   Resp 14   Wt 154 lb (69.9 kg)   LMP 05/29/2017   BMI 32.47 kg/m     General appearance: alert, cooperative and appears stated age Neck: no adenopathy, supple, symmetrical, trachea midline and thyroid normal to inspection and palpation Heart: regular rate and rhythm Lungs: CTAB Abdomen: soft, non-tender; very enlarged fibroid uterus (slightly difficult exam, the patient was not relaxing well). No significant decrease in size of the large fibroid, still up in her LUQ.  She does have a scar in the infraumbilical region from her prior hernia repair Extremities: normal, atraumatic, no cyanosis Skin: normal color, texture and turgor, no rashes or lesions Lymph: normal cervical supraclavicular and inguinal nodes Neurologic: grossly normal   ASSESSMENT Discussed total laparoscopic hysterectomy, bilateral salpingectomy and cystoscopy with the patient and her Aunt. Reviewed the risks of the procedure, including infection, bleeding, damage to bowel/badder/vessels/ureters.  Discussed the possible need for laparotomy. Discussed post operative recovery and risk of cuff dehiscence. All of her questions were answered     PLAN TLH/BS/Cystoscopy Will avoid umbilical incision given prior hernia repair Pre-op antibiotics and lovenox     An After Visit Summary was printed and given to the patient.

## 2017-06-12 ENCOUNTER — Other Ambulatory Visit: Payer: Self-pay | Admitting: *Deleted

## 2017-06-12 NOTE — Patient Outreach (Signed)
[facility]) Care Management  2/99/2426  Lillar Bianca Helmer [birth year] [id number]   Referral received regarding Ms. Gillette and care coordination needs prior to her hospital admission on Monday for a procedure. I reached out to Ms. Croll's care taker and guardian (appointed 07/12/2008; documentation on file in EMR/Media Manager) Ms. [name] (aunt) @ [phone] and was able to speak with her regarding Ms. Celmer's needs.   [name] relates that Ms. Bendall "cannot be left alone" and is seeking assistance for post surgical care in the home after Ms. Cail is discharged from the hospital next week (anticipated Tuesday morning) after a scheduled surgical procedure and overnight stay on Monday June 16 2017. I provided options/choices for in home care to [name] then made a referral to [facility] [phone], providing needed demographic and contact information to April Crutchfield after receiving [name]'s permission.   Plan:   I advised [name] to anticipate a call from a [facility] team member by tomorrow 06/13/2017 and requested that she return a call to me if she has not heard from a team member by noon tomorrow.   I will continue to follow Ms. Filler's care and progression closely over the next week's to ensure that care coordination and case management needs are met.   [facility] CM Care Plan Problem One     Most Recent Value  Care Plan Problem One  Post Surgical Care Needs/Community Resources Needs   Role Documenting the Problem One  Care Management Coordinator  Care Plan for Problem One  Active  [facility] Long Term Goal   Over the next 31 days, patient's caregiver/legal guardian will verbalize established plan for post surgical care and community resource support  [facility] Long Term Goal Start Date  06/12/17  Interventions for Problem One Long Term Goal  interview with legal guardian/caregiver,  review of anticipated post surgical care needs,  referral to  community agency  [facility] CM Short Term Goal #1   Over the next 24 hours, legal guardian/caregiver will verbalize receipt of contact from community care agency  [facility] CM Short Term Goal #1 Start Date  06/12/17  Interventions for Short Term Goal #1  referral to community agency made by phone  [facility] CM Short Term Goal #2   Over the next 5 days, caregiver/legal guardian will verbalize initiation of in home care services  [facility] CM Short Term Goal #2 Start Date  06/12/17  Interventions for Short Term Goal #2  discussed anticipated timeline for beginning of community care services  [facility] CM Short Term Goal #3  Over the next 14 days, patient's caregiver/legal guardian will verbalize plans for extended support through community agency, friends, family, church organizations  [facility] CM Short Term Goal #3 Start Date  06/12/17  Interventions for Short Tern Goal #3  discussed with patient's legal guardian need to outreach to friends/family/church organizations for additional support for care needed beyond initial post operative period      [employer] [facility]  [phone]

## 2017-06-12 NOTE — Patient Instructions (Signed)
[place]  9/[birth year]/3875      Your procedure is scheduled on 06-16-17   Report to [place]  at  5:30 A.M.  Call this number if you have problems the morning of surgery:[phone]  OUR ADDRESS IS [address], WE ARE LOCATED IN THE [facility] WITH [facility].   Remember:  Do not eat food or drink liquids after midnight.  Take these medicines the morning of surgery with A SIP OF WATER: None   Do not wear jewelry, make-up or nail polish.  Do not wear lotions, powders, or perfumes, or deoderant.  Do not shave 48 hours prior to surgery.    Do not bring valuables to the hospital.  [facility] is not responsible for any belongings or valuables.  Contacts, dentures or bridgework may not be worn into surgery.  Leave your suitcase in the car.  After surgery it may be brought to your room.  For patients admitted to the hospital, discharge time will be determined by your treatment team.  Patients discharged the day of surgery will not be allowed to drive home.   Special instructions:  Cough and Deep Breath Exercises. Bring medication in original pill bottle  Please read over the following fact sheets that you were given:       [facility] - Preparing for Surgery Before surgery, you can play an important role.  Because skin is not sterile, your skin needs to be as free of germs as possible.  You can reduce the number of germs on your skin by washing with CHG (chlorahexidine gluconate) soap before surgery.  CHG is an antiseptic cleaner which kills germs and bonds with the skin to continue killing germs even after washing. Please DO NOT use if you have an allergy to CHG or antibacterial soaps.  If your skin becomes reddened/irritated stop using the CHG and inform your nurse when you arrive at Short Stay. Do not shave (including legs and underarms) for at least 48 hours prior to the first CHG shower.  You may shave your face/neck. Please follow these  instructions carefully:  1.  Shower with CHG Soap the night before surgery and the  morning of Surgery.  2.  If you choose to wash your hair, wash your hair first as usual with your  normal  shampoo.  3.  After you shampoo, rinse your hair and body thoroughly to remove the  shampoo.                           4.  Use CHG as you would any other liquid soap.  You can apply chg directly  to the skin and wash                       Gently with a scrungie or clean washcloth.  5.  Apply the CHG Soap to your body ONLY FROM THE NECK DOWN.   Do not use on face/ open                           Wound or open sores. Avoid contact with eyes, ears mouth and genitals (private parts).                       Wash face,  Genitals (private parts) with your normal soap.  6.  Wash thoroughly, paying special attention to the area where your surgery  will be performed.  7.  Thoroughly rinse your body with warm water from the neck down.  8.  DO NOT shower/wash with your normal soap after using and rinsing off  the CHG Soap.                9.  Pat yourself dry with a clean towel.            10.  Wear clean pajamas.            11.  Place clean sheets on your bed the night of your first shower and do not  sleep with pets. Day of Surgery : Do not apply any lotions/deodorants the morning of surgery.  Please wear clean clothes to the hospital/surgery center.  FAILURE TO FOLLOW THESE INSTRUCTIONS MAY RESULT IN THE CANCELLATION OF YOUR SURGERY PATIENT SIGNATURE_________________________________  NURSE SIGNATURE__________________________________  ________________________________________________________________________  WHAT IS A BLOOD TRANSFUSION? Blood Transfusion Information  A transfusion is the replacement of blood or some of its parts. Blood is made up of multiple cells which provide different functions.  Red blood cells carry oxygen and are used for blood loss replacement.  White blood cells fight against  infection.  Platelets control bleeding.  Plasma helps clot blood.  Other blood products are available for specialized needs, such as hemophilia or other clotting disorders. BEFORE THE TRANSFUSION  Who gives blood for transfusions?   Healthy volunteers who are fully evaluated to make sure their blood is safe. This is blood bank blood. Transfusion therapy is the safest it has ever been in the practice of medicine. Before blood is taken from a donor, a complete history is taken to make sure that person has no history of diseases nor engages in risky social behavior (examples are intravenous drug use or sexual activity with multiple partners). The donor's travel history is screened to minimize risk of transmitting infections, such as malaria. The donated blood is tested for signs of infectious diseases, such as HIV and hepatitis. The blood is then tested to be sure it is compatible with you in order to minimize the chance of a transfusion reaction. If you or a relative donates blood, this is often done in anticipation of surgery and is not appropriate for emergency situations. It takes many days to process the donated blood. RISKS AND COMPLICATIONS Although transfusion therapy is very safe and saves many lives, the main dangers of transfusion include:   Getting an infectious disease.  Developing a transfusion reaction. This is an allergic reaction to something in the blood you were given. Every precaution is taken to prevent this. The decision to have a blood transfusion has been considered carefully by your caregiver before blood is given. Blood is not given unless the benefits outweigh the risks. AFTER THE TRANSFUSION  Right after receiving a blood transfusion, you will usually feel much better and more energetic. This is especially true if your red blood cells have gotten low (anemic). The transfusion raises the level of the red blood cells which carry oxygen, and this usually causes an energy  increase.  The nurse administering the transfusion will monitor you carefully for complications. HOME CARE INSTRUCTIONS  No special instructions are needed after a transfusion. You may find your energy is better. Speak with your caregiver about any limitations on activity for underlying diseases you may have. SEEK MEDICAL CARE IF:   Your condition is not improving after your  transfusion.  You develop redness or irritation at the intravenous (IV) site. SEEK IMMEDIATE MEDICAL CARE IF:  Any of the following symptoms occur over the next 12 hours:  Shaking chills.  You have a temperature by mouth above 102 F (38.9 C), not controlled by medicine.  Chest, back, or muscle pain.  People around you feel you are not acting correctly or are confused.  Shortness of breath or difficulty breathing.  Dizziness and fainting.  You get a rash or develop hives.  You have a decrease in urine output.  Your urine turns a dark color or changes to pink, red, or brown. Any of the following symptoms occur over the next 10 days:  You have a temperature by mouth above 102 F (38.9 C), not controlled by medicine.  Shortness of breath.  Weakness after normal activity.  The white part of the eye turns yellow (jaundice).  You have a decrease in the amount of urine or are urinating less often.  Your urine turns a dark color or changes to pink, red, or brown. Document Released: 03/01/2000 Document Revised: 05/27/2011 Document Reviewed: 10/19/2007 [facility] Patient Information 2014 [place], [place].  _______________________________________________________________________

## 2017-06-13 ENCOUNTER — Other Ambulatory Visit: Payer: Self-pay

## 2017-06-13 ENCOUNTER — Encounter (HOSPITAL_COMMUNITY): Payer: Self-pay

## 2017-06-13 ENCOUNTER — Encounter (HOSPITAL_COMMUNITY)
Admission: RE | Admit: 2017-06-13 | Discharge: 2017-06-13 | Disposition: A | Discharge status: Home / Self Care | Payer: PPO | Source: Ambulatory Visit | Attending: Obstetrics and Gynecology | Admitting: Obstetrics and Gynecology

## 2017-06-13 DIAGNOSIS — Z01818 Encounter for other preprocedural examination: Secondary | ICD-10-CM | POA: Diagnosis not present

## 2017-06-13 DIAGNOSIS — D259 Leiomyoma of uterus, unspecified: Secondary | ICD-10-CM | POA: Insufficient documentation

## 2017-06-13 LAB — COMPREHENSIVE METABOLIC PANEL
ALBUMIN: 3.3 g/dL — AB (ref 3.5–5.0)
ALT: 36 U/L (ref 14–54)
ANION GAP: 11 (ref 5–15)
AST: 27 U/L (ref 15–41)
Alkaline Phosphatase: 42 U/L (ref 38–126)
BUN: 10 mg/dL (ref 6–20)
CHLORIDE: 105 mmol/L (ref 101–111)
CO2: 24 mmol/L (ref 22–32)
Calcium: 9.3 mg/dL (ref 8.9–10.3)
Creatinine, Ser: 1.04 mg/dL — ABNORMAL HIGH (ref 0.44–1.00)
GFR calc Af Amer: >60 mL/min (ref >60)
GFR calc non Af Amer: >60 mL/min (ref >60)
GLUCOSE: 99 mg/dL (ref 65–99)
POTASSIUM: 4 mmol/L (ref 3.5–5.1)
SODIUM: 140 mmol/L (ref 135–145)
TOTAL PROTEIN: 8.4 g/dL — AB (ref 6.5–8.1)
Total Bilirubin: 0.6 mg/dL (ref 0.3–1.2)

## 2017-06-13 LAB — CBC
HCT: 31.8 % — ABNORMAL LOW (ref 36.0–46.0)
Hemoglobin: 10.3 g/dL — ABNORMAL LOW (ref 12.0–15.0)
MCH: 26.7 pg (ref 26.0–34.0)
MCHC: 32.4 g/dL (ref 30.0–36.0)
MCV: 82.4 fL (ref 78.0–100.0)
PLATELETS: 471 10*3/uL — AB (ref 150–400)
RBC: 3.86 MIL/uL — ABNORMAL LOW (ref 3.87–5.11)
RDW: 15.7 % — AB (ref 11.5–15.5)
WBC: 6 10*3/uL (ref 4.0–10.5)

## 2017-06-13 LAB — ABO/RH: ABO/RH(D): B POS

## 2017-06-13 LAB — HCG, SERUM, QUALITATIVE: PREG SERUM: NEGATIVE

## 2017-06-13 NOTE — Progress Notes (Signed)
06-13-17 Pt unable to urinate for urine pregnancy test. Serum pregnancy test obtained. Pt's surgery date is 06-16-17.

## 2017-06-16 ENCOUNTER — Encounter (HOSPITAL_BASED_OUTPATIENT_CLINIC_OR_DEPARTMENT_OTHER)
Admission: RE | Disposition: A | Discharge status: Home / Self Care | Payer: Self-pay | Source: Other Acute Inpatient Hospital | Attending: Obstetrics and Gynecology

## 2017-06-16 ENCOUNTER — Other Ambulatory Visit: Payer: Self-pay

## 2017-06-16 ENCOUNTER — Encounter (HOSPITAL_BASED_OUTPATIENT_CLINIC_OR_DEPARTMENT_OTHER): Payer: Self-pay | Admitting: *Deleted

## 2017-06-16 ENCOUNTER — Ambulatory Visit (HOSPITAL_BASED_OUTPATIENT_CLINIC_OR_DEPARTMENT_OTHER): Payer: PPO | Admitting: Anesthesiology

## 2017-06-16 ENCOUNTER — Ambulatory Visit (HOSPITAL_BASED_OUTPATIENT_CLINIC_OR_DEPARTMENT_OTHER)
Admission: RE | Admit: 2017-06-16 | Discharge: 2017-06-17 | Disposition: A | Discharge status: Home / Self Care | Payer: PPO | Source: Other Acute Inpatient Hospital | Attending: Obstetrics and Gynecology | Admitting: Obstetrics and Gynecology

## 2017-06-16 DIAGNOSIS — Z841 Family history of disorders of kidney and ureter: Secondary | ICD-10-CM | POA: Diagnosis not present

## 2017-06-16 DIAGNOSIS — N938 Other specified abnormal uterine and vaginal bleeding: Secondary | ICD-10-CM | POA: Diagnosis not present

## 2017-06-16 DIAGNOSIS — Z79899 Other long term (current) drug therapy: Secondary | ICD-10-CM | POA: Insufficient documentation

## 2017-06-16 DIAGNOSIS — Z6830 Body mass index (BMI) 30.0-30.9, adult: Secondary | ICD-10-CM | POA: Diagnosis not present

## 2017-06-16 DIAGNOSIS — Z90710 Acquired absence of both cervix and uterus: Secondary | ICD-10-CM | POA: Diagnosis present

## 2017-06-16 DIAGNOSIS — Z803 Family history of malignant neoplasm of breast: Secondary | ICD-10-CM | POA: Insufficient documentation

## 2017-06-16 DIAGNOSIS — J309 Allergic rhinitis, unspecified: Secondary | ICD-10-CM | POA: Diagnosis not present

## 2017-06-16 DIAGNOSIS — Z823 Family history of stroke: Secondary | ICD-10-CM | POA: Insufficient documentation

## 2017-06-16 DIAGNOSIS — N803 Endometriosis of pelvic peritoneum: Secondary | ICD-10-CM | POA: Insufficient documentation

## 2017-06-16 DIAGNOSIS — F79 Unspecified intellectual disabilities: Secondary | ICD-10-CM | POA: Insufficient documentation

## 2017-06-16 DIAGNOSIS — Z8249 Family history of ischemic heart disease and other diseases of the circulatory system: Secondary | ICD-10-CM | POA: Insufficient documentation

## 2017-06-16 DIAGNOSIS — D259 Leiomyoma of uterus, unspecified: Secondary | ICD-10-CM | POA: Insufficient documentation

## 2017-06-16 DIAGNOSIS — N809 Endometriosis, unspecified: Secondary | ICD-10-CM | POA: Diagnosis not present

## 2017-06-16 DIAGNOSIS — Z808 Family history of malignant neoplasm of other organs or systems: Secondary | ICD-10-CM | POA: Diagnosis not present

## 2017-06-16 DIAGNOSIS — J45909 Unspecified asthma, uncomplicated: Secondary | ICD-10-CM | POA: Insufficient documentation

## 2017-06-16 DIAGNOSIS — N838 Other noninflammatory disorders of ovary, fallopian tube and broad ligament: Secondary | ICD-10-CM | POA: Insufficient documentation

## 2017-06-16 DIAGNOSIS — D649 Anemia, unspecified: Secondary | ICD-10-CM | POA: Insufficient documentation

## 2017-06-16 DIAGNOSIS — E669 Obesity, unspecified: Secondary | ICD-10-CM | POA: Diagnosis not present

## 2017-06-16 DIAGNOSIS — N946 Dysmenorrhea, unspecified: Secondary | ICD-10-CM | POA: Insufficient documentation

## 2017-06-16 HISTORY — PX: CYSTOSCOPY: SHX5120

## 2017-06-16 HISTORY — PX: TOTAL LAPAROSCOPIC HYSTERECTOMY WITH SALPINGECTOMY: SHX6742

## 2017-06-16 LAB — CBC
HCT: 29.9 % — ABNORMAL LOW (ref 36.0–46.0)
Hemoglobin: 9.4 g/dL — ABNORMAL LOW (ref 12.0–15.0)
MCH: 26.3 pg (ref 26.0–34.0)
MCHC: 31.4 g/dL (ref 30.0–36.0)
MCV: 83.5 fL (ref 78.0–100.0)
PLATELETS: 444 10*3/uL — AB (ref 150–400)
RBC: 3.58 MIL/uL — AB (ref 3.87–5.11)
RDW: 16.1 % — ABNORMAL HIGH (ref 11.5–15.5)
WBC: 9.4 10*3/uL (ref 4.0–10.5)

## 2017-06-16 LAB — TYPE AND SCREEN
ABO/RH(D): B POS
Antibody Screen: NEGATIVE

## 2017-06-16 SURGERY — HYSTERECTOMY, TOTAL, LAPAROSCOPIC, WITH SALPINGECTOMY
Anesthesia: General

## 2017-06-16 MED ORDER — KETOROLAC TROMETHAMINE 30 MG/ML IJ SOLN
INTRAMUSCULAR | Status: AC
  Filled 2017-06-16: qty 1

## 2017-06-16 MED ORDER — LACTATED RINGERS IV SOLN
INTRAVENOUS | Status: DC
  Administered 2017-06-16 (×5): via INTRAVENOUS
  Filled 2017-06-16: qty 1000

## 2017-06-16 MED ORDER — OXYCODONE-ACETAMINOPHEN 5-325 MG PO TABS
2.0000 | ORAL_TABLET | ORAL | Status: DC | PRN
  Filled 2017-06-16: qty 2

## 2017-06-16 MED ORDER — DEXAMETHASONE SODIUM PHOSPHATE 10 MG/ML IJ SOLN
INTRAMUSCULAR | Status: AC
  Filled 2017-06-16: qty 1

## 2017-06-16 MED ORDER — FENTANYL CITRATE (PF) 100 MCG/2ML IJ SOLN
INTRAMUSCULAR | Status: DC | PRN
  Administered 2017-06-16: 100 ug via INTRAVENOUS
  Administered 2017-06-16 (×3): 50 ug via INTRAVENOUS

## 2017-06-16 MED ORDER — EPHEDRINE SULFATE 50 MG/ML IJ SOLN
INTRAMUSCULAR | Status: DC | PRN
  Administered 2017-06-16 (×2): 10 mg via INTRAVENOUS
  Administered 2017-06-16: 5 mg via INTRAVENOUS
  Administered 2017-06-16 (×2): 10 mg via INTRAVENOUS

## 2017-06-16 MED ORDER — DOCUSATE SODIUM 100 MG PO CAPS
ORAL_CAPSULE | ORAL | Status: AC
  Filled 2017-06-16: qty 1

## 2017-06-16 MED ORDER — CEFOTETAN DISODIUM-DEXTROSE 2-2.08 GM-%(50ML) IV SOLR
2.0000 g | Freq: Once | INTRAVENOUS | Status: AC
  Administered 2017-06-16: 2 g via INTRAVENOUS
  Filled 2017-06-16: qty 50

## 2017-06-16 MED ORDER — MEPERIDINE HCL 25 MG/ML IJ SOLN
6.2500 mg | INTRAMUSCULAR | Status: DC | PRN
  Filled 2017-06-16: qty 1

## 2017-06-16 MED ORDER — SUGAMMADEX SODIUM 200 MG/2ML IV SOLN
INTRAVENOUS | Status: AC
  Filled 2017-06-16: qty 2

## 2017-06-16 MED ORDER — MENTHOL 3 MG MT LOZG
1.0000 | LOZENGE | OROMUCOSAL | Status: DC | PRN
  Filled 2017-06-16: qty 9

## 2017-06-16 MED ORDER — LIDOCAINE 2% (20 MG/ML) 5 ML SYRINGE
INTRAMUSCULAR | Status: AC
  Filled 2017-06-16: qty 5

## 2017-06-16 MED ORDER — KETOROLAC TROMETHAMINE 30 MG/ML IJ SOLN
30.0000 mg | Freq: Four times a day (QID) | INTRAMUSCULAR | Status: DC
  Administered 2017-06-16 – 2017-06-17 (×3): 30 mg via INTRAVENOUS
  Filled 2017-06-16: qty 1

## 2017-06-16 MED ORDER — ENOXAPARIN SODIUM 40 MG/0.4ML ~~LOC~~ SOLN
SUBCUTANEOUS | Status: AC
  Filled 2017-06-16: qty 0.4

## 2017-06-16 MED ORDER — PROPOFOL 10 MG/ML IV BOLUS
INTRAVENOUS | Status: DC | PRN
  Administered 2017-06-16: 150 mg via INTRAVENOUS

## 2017-06-16 MED ORDER — ONDANSETRON HCL 4 MG PO TABS
4.0000 mg | ORAL_TABLET | Freq: Four times a day (QID) | ORAL | Status: DC | PRN
  Filled 2017-06-16: qty 1

## 2017-06-16 MED ORDER — VASOPRESSIN 20 UNIT/ML IV SOLN
INTRAVENOUS | Status: DC | PRN
  Administered 2017-06-16: 18 mL via INTRAMUSCULAR

## 2017-06-16 MED ORDER — ONDANSETRON HCL 4 MG/2ML IJ SOLN
4.0000 mg | Freq: Four times a day (QID) | INTRAMUSCULAR | Status: DC | PRN
  Filled 2017-06-16: qty 2

## 2017-06-16 MED ORDER — ARTIFICIAL TEARS OPHTHALMIC OINT
TOPICAL_OINTMENT | OPHTHALMIC | Status: AC
  Filled 2017-06-16: qty 3.5

## 2017-06-16 MED ORDER — ENOXAPARIN SODIUM 40 MG/0.4ML ~~LOC~~ SOLN
40.0000 mg | SUBCUTANEOUS | Status: AC
  Administered 2017-06-16: 40 mg via SUBCUTANEOUS
  Filled 2017-06-16: qty 0.4

## 2017-06-16 MED ORDER — HEMOSTATIC AGENTS (NO CHARGE) OPTIME
TOPICAL | Status: DC | PRN
  Administered 2017-06-16: 1 via TOPICAL

## 2017-06-16 MED ORDER — ALUM & MAG HYDROXIDE-SIMETH 200-200-20 MG/5ML PO SUSP
30.0000 mL | ORAL | Status: DC | PRN
  Filled 2017-06-16: qty 30

## 2017-06-16 MED ORDER — KETOROLAC TROMETHAMINE 30 MG/ML IJ SOLN
INTRAMUSCULAR | Status: DC | PRN
  Administered 2017-06-16: 30 mg via INTRAVENOUS

## 2017-06-16 MED ORDER — SUGAMMADEX SODIUM 200 MG/2ML IV SOLN
INTRAVENOUS | Status: DC | PRN
  Administered 2017-06-16: 400 mg via INTRAVENOUS

## 2017-06-16 MED ORDER — ONDANSETRON HCL 4 MG/2ML IJ SOLN
INTRAMUSCULAR | Status: AC
  Filled 2017-06-16: qty 2

## 2017-06-16 MED ORDER — ACETAMINOPHEN 325 MG PO TABS
650.0000 mg | ORAL_TABLET | ORAL | Status: DC | PRN
  Filled 2017-06-16: qty 2

## 2017-06-16 MED ORDER — OXYCODONE HCL 5 MG/5ML PO SOLN
5.0000 mg | Freq: Once | ORAL | Status: DC | PRN
  Filled 2017-06-16: qty 5

## 2017-06-16 MED ORDER — SODIUM CHLORIDE 0.9 % IV SOLN
2.0000 g | INTRAVENOUS | Status: AC
  Filled 2017-06-16: qty 2

## 2017-06-16 MED ORDER — OXYCODONE-ACETAMINOPHEN 5-325 MG PO TABS
ORAL_TABLET | ORAL | Status: AC
  Filled 2017-06-16: qty 1

## 2017-06-16 MED ORDER — ONDANSETRON HCL 4 MG/2ML IJ SOLN
INTRAMUSCULAR | Status: DC | PRN
  Administered 2017-06-16: 4 mg via INTRAVENOUS

## 2017-06-16 MED ORDER — PROMETHAZINE HCL 25 MG/ML IJ SOLN
6.2500 mg | INTRAMUSCULAR | Status: DC | PRN
  Filled 2017-06-16: qty 1

## 2017-06-16 MED ORDER — DEXTROSE 10 % IV SOLN
INTRAVENOUS | Status: AC | PRN
  Administered 2017-06-16: 1000 mL via INTRAVENOUS

## 2017-06-16 MED ORDER — OXYCODONE HCL 5 MG PO TABS
5.0000 mg | ORAL_TABLET | Freq: Once | ORAL | Status: DC | PRN
  Filled 2017-06-16: qty 1

## 2017-06-16 MED ORDER — HYDROMORPHONE HCL 1 MG/ML IJ SOLN
0.2000 mg | INTRAMUSCULAR | Status: DC | PRN
  Filled 2017-06-16: qty 1

## 2017-06-16 MED ORDER — MIDAZOLAM HCL 2 MG/2ML IJ SOLN
INTRAMUSCULAR | Status: DC | PRN
  Administered 2017-06-16: 1 mg via INTRAVENOUS

## 2017-06-16 MED ORDER — CEFOTETAN DISODIUM-DEXTROSE 2-2.08 GM-%(50ML) IV SOLR
INTRAVENOUS | Status: AC
  Filled 2017-06-16: qty 50

## 2017-06-16 MED ORDER — SCOPOLAMINE 1 MG/3DAYS TD PT72
MEDICATED_PATCH | TRANSDERMAL | Status: DC | PRN
  Administered 2017-06-16: 1 via TRANSDERMAL

## 2017-06-16 MED ORDER — PROPOFOL 10 MG/ML IV BOLUS
INTRAVENOUS | Status: AC
  Filled 2017-06-16: qty 40

## 2017-06-16 MED ORDER — ROCURONIUM BROMIDE 10 MG/ML (PF) SYRINGE
PREFILLED_SYRINGE | INTRAVENOUS | Status: DC | PRN
  Administered 2017-06-16: 50 mg via INTRAVENOUS
  Administered 2017-06-16: 20 mg via INTRAVENOUS
  Administered 2017-06-16: 10 mg via INTRAVENOUS
  Administered 2017-06-16: 20 mg via INTRAVENOUS
  Administered 2017-06-16 (×2): 10 mg via INTRAVENOUS

## 2017-06-16 MED ORDER — LIDOCAINE 2% (20 MG/ML) 5 ML SYRINGE
INTRAMUSCULAR | Status: DC | PRN
  Administered 2017-06-16: 100 mg via INTRAVENOUS

## 2017-06-16 MED ORDER — BUPIVACAINE HCL (PF) 0.25 % IJ SOLN
INTRAMUSCULAR | Status: DC | PRN
  Administered 2017-06-16: 20 mL

## 2017-06-16 MED ORDER — DEXAMETHASONE SODIUM PHOSPHATE 10 MG/ML IJ SOLN
INTRAMUSCULAR | Status: DC | PRN
  Administered 2017-06-16: 10 mg via INTRAVENOUS

## 2017-06-16 MED ORDER — ACETAMINOPHEN 10 MG/ML IV SOLN
INTRAVENOUS | Status: DC | PRN
  Administered 2017-06-16: 1000 mg via INTRAVENOUS

## 2017-06-16 MED ORDER — CEFOTETAN DISODIUM 2 G IJ SOLR
2.0000 g | INTRAMUSCULAR | Status: AC
  Administered 2017-06-16: 2 g via INTRAVENOUS
  Filled 2017-06-16: qty 2

## 2017-06-16 MED ORDER — DOCUSATE SODIUM 100 MG PO CAPS
100.0000 mg | ORAL_CAPSULE | Freq: Two times a day (BID) | ORAL | Status: DC
  Administered 2017-06-16 – 2017-06-17 (×3): 100 mg via ORAL
  Filled 2017-06-16: qty 1

## 2017-06-16 MED ORDER — MIDAZOLAM HCL 2 MG/2ML IJ SOLN
INTRAMUSCULAR | Status: AC
  Filled 2017-06-16: qty 2

## 2017-06-16 MED ORDER — HYDROMORPHONE HCL 1 MG/ML IJ SOLN
0.2500 mg | INTRAMUSCULAR | Status: DC | PRN
  Filled 2017-06-16: qty 0.5

## 2017-06-16 MED ORDER — ROCURONIUM BROMIDE 10 MG/ML (PF) SYRINGE
PREFILLED_SYRINGE | INTRAVENOUS | Status: AC
  Filled 2017-06-16: qty 5

## 2017-06-16 MED ORDER — OXYCODONE-ACETAMINOPHEN 5-325 MG PO TABS
1.0000 | ORAL_TABLET | ORAL | Status: DC | PRN
  Administered 2017-06-16 – 2017-06-17 (×4): 1 via ORAL
  Filled 2017-06-16: qty 1

## 2017-06-16 MED ORDER — FENTANYL CITRATE (PF) 250 MCG/5ML IJ SOLN
INTRAMUSCULAR | Status: AC
  Filled 2017-06-16: qty 5

## 2017-06-16 MED ORDER — KETOROLAC TROMETHAMINE 30 MG/ML IJ SOLN
30.0000 mg | Freq: Four times a day (QID) | INTRAMUSCULAR | Status: DC
  Filled 2017-06-16: qty 1

## 2017-06-16 MED ORDER — ENOXAPARIN SODIUM 40 MG/0.4ML ~~LOC~~ SOLN
40.0000 mg | SUBCUTANEOUS | Status: DC
  Administered 2017-06-17: 40 mg via SUBCUTANEOUS
  Filled 2017-06-16: qty 0.4

## 2017-06-16 MED ORDER — KCL IN DEXTROSE-NACL 20-5-0.45 MEQ/L-%-% IV SOLN
INTRAVENOUS | Status: DC
  Administered 2017-06-16 – 2017-06-17 (×2): via INTRAVENOUS
  Filled 2017-06-16 (×4): qty 1000

## 2017-06-16 SURGICAL SUPPLY — 75 items
ADH SKN CLS APL DERMABOND .7 (GAUZE/BANDAGES/DRESSINGS) ×2
APL SRG 38 LTWT LNG FL B (MISCELLANEOUS) ×2
APPLICATOR ARISTA FLEXITIP XL (MISCELLANEOUS) ×3 IMPLANT
BLADE SURG 10 STRL SS (BLADE) ×22 IMPLANT
CABLE HIGH FREQUENCY MONO STRZ (ELECTRODE) IMPLANT
CANISTER SUCT 3000ML PPV (MISCELLANEOUS) ×4 IMPLANT
CELL SAVER LIPIGURD (MISCELLANEOUS) IMPLANT
CONT SPEC 4OZ CLIKSEAL STRL BL (MISCELLANEOUS) ×2 IMPLANT
COVER MAYO STAND STRL (DRAPES) ×4 IMPLANT
COVER TABLE BACK 60X90 (DRAPES) IMPLANT
DECANTER SPIKE VIAL GLASS SM (MISCELLANEOUS) ×12 IMPLANT
DERMABOND ADVANCED (GAUZE/BANDAGES/DRESSINGS) ×2
DERMABOND ADVANCED .7 DNX12 (GAUZE/BANDAGES/DRESSINGS) ×2 IMPLANT
DEVICE RETRIEVAL ALEXIS 14 (MISCELLANEOUS) IMPLANT
DRAPE IMP U-DRAPE 54X76 (DRAPES) ×2 IMPLANT
DRSG COVADERM PLUS 2X2 (GAUZE/BANDAGES/DRESSINGS) ×2 IMPLANT
DRSG OPSITE POSTOP 3X4 (GAUZE/BANDAGES/DRESSINGS) ×3 IMPLANT
DURAPREP 26ML APPLICATOR (WOUND CARE) ×4 IMPLANT
EXTRT SYSTEM ALEXIS 14CM (MISCELLANEOUS)
EXTRT SYSTEM ALEXIS 17CM (MISCELLANEOUS) ×4
GAUZE SPONGE 4X4 16PLY XRAY LF (GAUZE/BANDAGES/DRESSINGS) ×2 IMPLANT
GLOVE BIO SURGEON STRL SZ 6.5 (GLOVE) ×6 IMPLANT
GLOVE BIO SURGEONS STRL SZ 6.5 (GLOVE) ×4
GLOVE BIOGEL PI IND STRL 7.0 (GLOVE) ×8 IMPLANT
GLOVE BIOGEL PI INDICATOR 7.0 (GLOVE) ×8
GOWN STRL REUS W/TWL LRG LVL3 (GOWN DISPOSABLE) ×16 IMPLANT
HARMONIC RUM II 2.5CM SILVER (DISPOSABLE) ×4
HARMONIC RUM II 3.0CM SILVER (DISPOSABLE)
HARMONIC RUM II 3.5CM SILVER (DISPOSABLE)
HARMONIC RUM II 4.0CM SILVER (DISPOSABLE)
HEMOSTAT ARISTA ABSORB 3G PWDR (MISCELLANEOUS) ×3 IMPLANT
LIGASURE VESSEL 5MM BLUNT TIP (ELECTROSURGICAL) ×4 IMPLANT
NEEDLE INSUFFLATION 120MM (ENDOMECHANICALS) ×4 IMPLANT
PACK LAPAROSCOPY BASIN (CUSTOM PROCEDURE TRAY) ×4 IMPLANT
PACK TRENDGUARD 450 HYBRID PRO (MISCELLANEOUS) IMPLANT
PACK TRENDGUARD 600 HYBRD PROC (MISCELLANEOUS) IMPLANT
POUCH LAPAROSCOPIC INSTRUMENT (MISCELLANEOUS) ×4 IMPLANT
PROTECTOR NERVE ULNAR (MISCELLANEOUS) ×8 IMPLANT
RETRACTOR WOUND ALXS 19CM XSML (INSTRUMENTS) IMPLANT
RTRCTR WOUND ALEXIS 18CM SML (INSTRUMENTS) ×4
RTRCTR WOUND ALEXIS 19CM XSML (INSTRUMENTS) ×12
SAVER CELL AAL HAEMONETICS (INSTRUMENTS) ×1 IMPLANT
SCALPEL HRMNC RUM II 2.5 SILVR (DISPOSABLE) ×1 IMPLANT
SCALPEL HRMNC RUM II 3.0 SILVR (DISPOSABLE) IMPLANT
SCALPEL HRMNC RUM II 3.5 SILVR (DISPOSABLE) IMPLANT
SCALPEL HRMNC RUM II 4.0 SILVR (DISPOSABLE) IMPLANT
SCISSORS LAP 5X35 DISP (ENDOMECHANICALS) ×2 IMPLANT
SET CYSTO W/LG BORE CLAMP LF (SET/KITS/TRAYS/PACK) ×4 IMPLANT
SET IRRIG TUBING LAPAROSCOPIC (IRRIGATION / IRRIGATOR) ×4 IMPLANT
SET TRI-LUMEN FLTR TB AIRSEAL (TUBING) ×6 IMPLANT
SHEARS HARMONIC ACE PLUS 36CM (ENDOMECHANICALS) ×4 IMPLANT
SLEEVE SURGEON STRL (DRAPES) ×2 IMPLANT
SUT VIC AB 0 CT1 36 (SUTURE) ×4 IMPLANT
SUT VIC AB 3-0 PS2 18 (SUTURE) ×4
SUT VIC AB 3-0 PS2 18XBRD (SUTURE) ×2 IMPLANT
SUT VICRYL 0 UR6 27IN ABS (SUTURE) ×4 IMPLANT
SUT VICRYL 4-0 PS2 18IN ABS (SUTURE) ×4 IMPLANT
SUT VLOC 180 0 9IN  GS21 (SUTURE) ×2
SUT VLOC 180 0 9IN GS21 (SUTURE) ×1 IMPLANT
SYR 50ML LL SCALE MARK (SYRINGE) ×8 IMPLANT
SYSTEM CONTND EXTRCTN KII BLLN (MISCELLANEOUS) IMPLANT
TIP RUMI ORANGE 6.7MMX12CM (TIP) IMPLANT
TIP UTERINE 5.1X6CM LAV DISP (MISCELLANEOUS) IMPLANT
TIP UTERINE 6.7X10CM GRN DISP (MISCELLANEOUS) ×2 IMPLANT
TIP UTERINE 6.7X6CM WHT DISP (MISCELLANEOUS) IMPLANT
TIP UTERINE 6.7X8CM BLUE DISP (MISCELLANEOUS) IMPLANT
TOWEL OR 17X24 6PK STRL BLUE (TOWEL DISPOSABLE) ×8 IMPLANT
TRAY FOLEY CATH SILVER 14FR (SET/KITS/TRAYS/PACK) ×4 IMPLANT
TRENDGUARD 450 HYBRID PRO PACK (MISCELLANEOUS) ×4
TRENDGUARD 600 HYBRID PROC PK (MISCELLANEOUS)
TROCAR ADV FIXATION 5X100MM (TROCAR) ×4 IMPLANT
TROCAR PORT AIRSEAL 5X120 (TROCAR) ×7 IMPLANT
TROCAR XCEL NON BLADE 8MM B8LT (ENDOMECHANICALS) ×4 IMPLANT
TROCAR XCEL NON-BLD 5MMX100MML (ENDOMECHANICALS) ×4 IMPLANT
WARMER LAPAROSCOPE (MISCELLANEOUS) ×4 IMPLANT

## 2017-06-16 NOTE — Progress Notes (Signed)
16Fr Foley catheter inserted under sterile technique without difficulty. Pt tolerated procedure well. Immediately returned 350 ml clear yellow odorless urine. Secured catheter w/ leg strap.  Continue to follow.  Blair Hailey, RN

## 2017-06-16 NOTE — Progress Notes (Addendum)
06/16/2017 Pt. With post void residual of 313 ml , Dr. Talbert Nan paged and made aware. Verbal order received to perform I&O cath x1. Orders enacted. 600 ml of clear yellow urine removed from bladder. Pt. Tolerated well, states relief of her symptoms.  Josepha Barbier, Arville Lime

## 2017-06-16 NOTE — Anesthesia Preprocedure Evaluation (Addendum)
Anesthesia Evaluation  Patient identified by MRN, date of birth, ID band Patient awake    Reviewed: Allergy & Precautions, NPO status , Patient's Chart, lab work & pertinent test results  Airway Mallampati: II  TM Distance: >3 FB Neck ROM: Full    Dental no notable dental hx. (+) Teeth Intact, Dental Advisory Given,    Pulmonary neg pulmonary ROS,    Pulmonary exam normal breath sounds clear to auscultation       Cardiovascular negative cardio ROS Normal cardiovascular exam Rhythm:Regular Rate:Normal     Neuro/Psych Mentally challenged negative neurological ROS  negative psych ROS   GI/Hepatic negative GI ROS, Neg liver ROS,   Endo/Other  negative endocrine ROS  Renal/GU negative Renal ROS  negative genitourinary   Musculoskeletal negative musculoskeletal ROS (+)   Abdominal (+) + obese,   Peds negative pediatric ROS (+)  Hematology negative hematology ROS (+)   Anesthesia Other Findings   Reproductive/Obstetrics negative OB ROS                            Anesthesia Physical  Anesthesia Plan  ASA: II  Anesthesia Plan: General   Post-op Pain Management:    Induction: Intravenous  PONV Risk Score and Plan: 3 and Ondansetron, Dexamethasone and Midazolam  Airway Management Planned: Oral ETT  Additional Equipment:   Intra-op Plan:   Post-operative Plan: Extubation in OR  Informed Consent: I have reviewed the patients History and Physical, chart, labs and discussed the procedure including the risks, benefits and alternatives for the proposed anesthesia with the patient or authorized representative who has indicated his/her understanding and acceptance.   Dental advisory given  Plan Discussed with: CRNA and Surgeon  Anesthesia Plan Comments:         Anesthesia Quick Evaluation

## 2017-06-16 NOTE — Interval H&P Note (Signed)
History and Physical Interval Note:  10/22/2822 1:75 AM  Catherine Hays  has presented today for surgery, with the diagnosis of uterine fibroids, dysmenorrhea  The various methods of treatment have been discussed with the patient and family. After consideration of risks, benefits and other options for treatment, the patient has consented to  Procedure(s) with comments: TOTAL LAPAROSCOPIC HYSTERECTOMY WITH SALPINGECTOMY (Bilateral) - 4 hours OR time CYSTOSCOPY (N/A) as a surgical intervention .  The patient's history has been reviewed, patient examined, no change in status, stable for surgery.  I have reviewed the patient's chart and labs.  Questions were answered to the patient's satisfaction.      Salvadore Dom

## 2017-06-16 NOTE — Progress Notes (Signed)
06/16/2017 3:11 PM Noted in diet orders for pudding thick liquids. Upon talking with patients caregiver and family, they decline that patient has trouble with aspirating thin liquids or trouble swallowing currently or in the past. Pt. Has had sips of thin liquids post op without difficulty, cough or throat clearing. Will continue to closely monitor patient.  Ondra Deboard, Arville Lime

## 2017-06-16 NOTE — Op Note (Addendum)
Preoperative Diagnosis: Symptomatic fibroid uterus  Postoperative Diagnosis: Symptomatic fibroid uterus, endometriosis  Procedure:  Total Laparoscopic Hysterectomy with bilateral salpingectomy, bilateral salpingectomy and cystoscopy  Surgeon: Dr Sumner Boast  Assistant: Dr Edwinna Areola  Anesthesia: General  EBL: 400 cc   Fluids: 2,700 cc LR  Urine output: 431 cc  Complications: none  Specimens: fibroid uterus, bilateral tubes, peritoneal biopsy.  Uterine weight: 1905 grams.   Indications for surgery: The patient is a 42 year old female, who presented with a symptomatic fibroid uterus. Work up included a normal CBC, TSH, pap smear, ultrasound hysteroscopy D&C with benign pathology. Over the course of approximately 6 months her large fibroid grew from 13 to 17 cm. The patient desired definitive treatment. She was pre-treated with lupron for 3 months with no shrinkage of her fibroid The patient and her guardian are aware of the risks and complications involved with the surgery and consent was obtained prior to the procedure.  Findings: EUA: large fibroid uterus, extending to just under her rib cage on the left. Laparoscopy: large pedunculated fibroid coming off of the right fundal region of the uterus, otherwise normal appearing uterus. Normal adnexa bilaterally. There was a focus of endometriosis in the right cul de sac.  Procedure: The patient was taken to the operating room with an IV in placed, preoperative antibiotics had been administered. She was placed in the dorsal lithotomy position. General anesthesia was administered. She was prepped and draped in the usual sterile fashion for an abdominal, vaginal surgery. A rumi uterine manipulator was placed, using a # 2.5 cup and a 10 cm extender. The uterine fundus was perforated with a dilator (no resistance noted). A foley catheter was placed.    An OG tube was placed. After infiltrating with 0.25% marcaine, an incision was made in  the left upper quadrant approximately 2 cm below the rib cage in the mid clavicular line with a #11 blade. A 5 mm opti-view trocar was inserted under direct visualization with the laparoscope. The abdominal cavity was insufflated with CO2, with normal intraabdominal pressures. After determining there was no mesh in the patients umbilicus, the umbilicus was everted, injected with 0.25% marcaine and incised with a # 11 blade. 2 towel clips were used to elevated the umbilicus away from the fibroid uterus and #8 trocar was placed into the abdominal cavity.  The patient was placed in trendelenburg and the abdominal pelvic cavity was inspected. 2 more trocars were placed: 1 in each lower quadrant approximately 3 cm medial to and superior to the anterior superior iliac spine. These areas were injected with 0.25% marcaine, incised with a #11 blade and all trocars were inserted with direct visualization with the laparoscope. A # 5 airseal trocar was placed in the RLQ and a 5 mm trocar in the LLQ. The abdominal pelvic cavity was again inspected.  The large pedical connecting the uterus to the 17 cm fibroid was injected with approximately 15 cc of a vasopressin mixture (20 IU on vasopressin in 100 cc of NS). The pedicle was then coagulated and cut with the harmonic scalpel. The base of the pedicle over the uterus with cauterized with the ligasure device.   The left tube was elevated from the pelvic sidewall, cauterized and cut with the ligasure device. The mesosalpinx was cauterized and cut with the ligasure device. The tube was separated from the uterus using the ligasure device and removed through the midline trocar. The left round ligament was cauterized and cut with the ligasure device  and the anterior and posterior leafs of the broad ligament were taken down with the ligasure device. The harmonic scalpel was then used to take down the bladder flap and skeltonize the vessels. The left uterine vessels were then clamped,  cauterized and ligated with the ligasure device. Hemostasis was excellent. The same procedure was repeated on the right.   Using the rumi manipulator the uterus was pushed up in the pelvic cavity and the harmonic scalpel was used to separate the cervix from the vagina using the harmonic energy. The uterus was  removed vaginally at this time. The large alexis bag was then placed into the abdominal cavity through the vagina.  An occluder was placed in the vagina to maintain pneumoperitoneum. The large fibroid was placed in the alexis bag. The umbilical trocar was removed and the incision was extended to approximately 3 cm. The end of the alexis bag was brought out through the incision and the fibroid was morcellated in the bag and taken out in pieces. This took approximately 2 hours because of the very large size of the uterus. The bag was removed intact. The fascia was then partially closed from either end with a #0 Vicryl. A hasan trocar was placed and the sutures were attached to the hasan.   The vaginal cuff was then closed with a 0 V-lock suture. Hemostasis was excellent. The abdominal pelvic cavity was irrigated and suctioned dry. The area of endometriosis in the right cul de sac was removed using a harmonic scalpel and sent to pathology.   Pressure was released and hemostasis remained excellent. Arista was placed over the surgical site.   The hasan trocar was removed and the 0 Vicryl stitches were tied together. The abdominal cavity was desufflated and the trocars were removed. The skin was closed with subcuticular stiches of 4-0 vicryl and dermabond was placed over the incisions.  The foley catheter was removed and cystoscopy was performed using a 70 degree scope. Both ureters expelled urine, no bladder abnormalities were noted. The bladder was allowed to drain and the cystoscope was removed.   The patient's abdomen and perineum were cleansed and she was taken out of the dorsal lithotomy position.  Upon awakening she was extubated and taken to the recovery room in stable condition. The sponge and instrument counts were correct.   Addendum: because of the very large size of the fibroid uterus the surgery took over 5 hours, more than 2 x the length and difficulty of a typical laparoscopic hysterectomy.  Under procedure: resection of endometriosis should also be included.

## 2017-06-16 NOTE — Anesthesia Postprocedure Evaluation (Signed)
Anesthesia Post Note  Patient: Destenie Dawn Acri  Procedure(s) Performed: TOTAL LAPAROSCOPIC HYSTERECTOMY WITH SALPINGECTOMY, FUGERATION OF ENDOMETRIOSIS (Bilateral ) CYSTOSCOPY (N/A )     Patient location during evaluation: PACU Anesthesia Type: General Level of consciousness: awake and alert Pain management: pain level controlled Vital Signs Assessment: post-procedure vital signs reviewed and stable Respiratory status: spontaneous breathing, nonlabored ventilation, respiratory function stable and patient connected to nasal cannula oxygen Cardiovascular status: blood pressure returned to baseline and stable Postop Assessment: no apparent nausea or vomiting Anesthetic complications: no    Last Vitals:  Vitals:   06/16/17 1330 06/16/17 1345  BP: 131/82 124/83  Pulse: 69 71  Resp: (!) 22 (!) 26  Temp:    SpO2: 99% 100%    Last Pain:  Vitals:   06/16/17 1330  TempSrc:   PainSc: 0-No pain                 Dymon Summerhill S

## 2017-06-16 NOTE — Transfer of Care (Signed)
Immediate Anesthesia Transfer of Care Note  Patient: Catherine Hays  Procedure(s) Performed: TOTAL LAPAROSCOPIC HYSTERECTOMY WITH SALPINGECTOMY, FUGERATION OF ENDOMETRIOSIS (Bilateral ) CYSTOSCOPY (N/A )  Patient Location: PACU  Anesthesia Type:General  Level of Consciousness: awake, alert , oriented and patient cooperative  Airway & Oxygen Therapy: Patient Spontanous Breathing and Patient connected to nasal cannula oxygen  Post-op Assessment: Report given to RN and Post -op Vital signs reviewed and stable  Post vital signs: Reviewed and stable  Last Vitals:  Vitals Value Taken Time  BP    Temp    Pulse    Resp    SpO2      Last Pain:  Vitals:   06/16/17 0622  TempSrc:   PainSc: 0-No pain      Patients Stated Pain Goal: 5 (13/08/65 7846)  Complications: No apparent anesthesia complications

## 2017-06-16 NOTE — Anesthesia Procedure Notes (Signed)
Procedure Name: Intubation Date/Time: 06/16/2017 7:35 AM Performed by: Wanita Chamberlain, CRNA Pre-anesthesia Checklist: Patient identified, Emergency Drugs available, Suction available, Patient being monitored and Timeout performed Patient Re-evaluated:Patient Re-evaluated prior to induction Oxygen Delivery Method: Circle system utilized Preoxygenation: Pre-oxygenation with 100% oxygen Induction Type: IV induction Ventilation: Mask ventilation without difficulty Laryngoscope Size: Mac and 3 Grade View: Grade I Tube type: Oral Tube size: 7.0 mm Number of attempts: 1 Airway Equipment and Method: Stylet and Bite block Placement Confirmation: ETT inserted through vocal cords under direct vision,  positive ETCO2,  CO2 detector and breath sounds checked- equal and bilateral Secured at: 21 cm Tube secured with: Tape Dental Injury: Teeth and Oropharynx as per pre-operative assessment

## 2017-06-16 NOTE — Progress Notes (Signed)
Day of Surgery Procedure(s) (LRB): TOTAL LAPAROSCOPIC HYSTERECTOMY WITH SALPINGECTOMY, removal OF ENDOMETRIOSIS (Bilateral) CYSTOSCOPY (N/A)  Subjective: Patient reports mild discomfort. She has been ambulating, tolerating po. She voided a small amount.    Objective: I have reviewed patient's vital signs and intake and output. She was I&O catheterized for 600 cc  Today's Vitals   06/16/17 1500 06/16/17 1600 06/16/17 1648 06/16/17 1745  BP: 131/72 136/81 127/73   Pulse:  82 72   Resp: (!) 22 (!) 22 20   Temp: (!) 97.5 F (36.4 C) 97.7 F (36.5 C) 98.1 F (36.7 C)   TempSrc:   Oral   SpO2: 100% 100% 100%   Weight:      Height:      PainSc:    Asleep    This SmartLink has not been configured with any valid records.   Lab Results  Component Value Date   WBC 9.4 06/16/2017   HGB 9.4 (L) 06/16/2017   HCT 29.9 (L) 06/16/2017   MCV 83.5 06/16/2017   PLT 444 (H) 06/16/2017     General: alert and no distress Resp: clear to auscultation bilaterally Cardio: S1, S2 normal GI: soft, appropriately tender, mildly distended. Incisions: clean, dry and intact without erythema. Bandage over umbilical incision. Extremities: extremities normal, atraumatic, no cyanosis or edema Vaginal Bleeding: none  Assessment: s/p Procedure(s) with comments: TOTAL LAPAROSCOPIC HYSTERECTOMY WITH SALPINGECTOMY, FUGERATION OF ENDOMETRIOSIS (Bilateral) - 4 hours OR time CYSTOSCOPY (N/A): stable, progressing well and urinary retention  Plan: Encourage ambulation, second dose of antibiotics was given secondary to prolonged surgery If she needs to be catheterized again will place foley overnight  LOS: 0 days    Salvadore Dom 06/16/2017, 6:14 PM

## 2017-06-16 NOTE — Progress Notes (Signed)
Pt voided 300 ml clear yellow urine. Post void bladder scan revealed 394 ml remaining in bladder. Per MD order, since residual is > 300 ml, will place foley. Pt and guardian at bedside educated on plan of care, and verbalized understanding.  Blair Hailey, RN

## 2017-06-17 ENCOUNTER — Other Ambulatory Visit: Payer: Self-pay | Admitting: *Deleted

## 2017-06-17 ENCOUNTER — Encounter (HOSPITAL_BASED_OUTPATIENT_CLINIC_OR_DEPARTMENT_OTHER): Payer: Self-pay | Admitting: Obstetrics and Gynecology

## 2017-06-17 ENCOUNTER — Telehealth: Payer: Self-pay | Admitting: Obstetrics and Gynecology

## 2017-06-17 DIAGNOSIS — N838 Other noninflammatory disorders of ovary, fallopian tube and broad ligament: Secondary | ICD-10-CM | POA: Diagnosis not present

## 2017-06-17 LAB — CBC
HCT: 24.6 % — ABNORMAL LOW (ref 36.0–46.0)
HEMOGLOBIN: 7.9 g/dL — AB (ref 12.0–15.0)
MCH: 26.5 pg (ref 26.0–34.0)
MCHC: 32.1 g/dL (ref 30.0–36.0)
MCV: 82.6 fL (ref 78.0–100.0)
Platelets: 376 10*3/uL (ref 150–400)
RBC: 2.98 MIL/uL — AB (ref 3.87–5.11)
RDW: 15.8 % — ABNORMAL HIGH (ref 11.5–15.5)
WBC: 9.5 10*3/uL (ref 4.0–10.5)

## 2017-06-17 LAB — BASIC METABOLIC PANEL
Anion gap: 7 (ref 5–15)
BUN: 6 mg/dL (ref 6–20)
CHLORIDE: 107 mmol/L (ref 101–111)
CO2: 27 mmol/L (ref 22–32)
CREATININE: 1.13 mg/dL — AB (ref 0.44–1.00)
Calcium: 8 mg/dL — ABNORMAL LOW (ref 8.9–10.3)
GFR calc Af Amer: >60 mL/min (ref >60)
GFR calc non Af Amer: 60 mL/min — ABNORMAL LOW (ref >60)
GLUCOSE: 111 mg/dL — AB (ref 65–99)
POTASSIUM: 4.1 mmol/L (ref 3.5–5.1)
SODIUM: 141 mmol/L (ref 135–145)

## 2017-06-17 MED ORDER — ENOXAPARIN SODIUM 40 MG/0.4ML ~~LOC~~ SOLN
SUBCUTANEOUS | Status: AC
  Filled 2017-06-17: qty 0.4

## 2017-06-17 MED ORDER — IBUPROFEN 800 MG PO TABS: 800.0000 mg | ORAL_TABLET | Freq: Three times a day (TID) | ORAL | 0 refills | Status: DC | PRN

## 2017-06-17 MED ORDER — DOCUSATE SODIUM 100 MG PO CAPS
ORAL_CAPSULE | ORAL | Status: AC
  Filled 2017-06-17: qty 1

## 2017-06-17 MED ORDER — DOCUSATE SODIUM 100 MG PO CAPS: 100.0000 mg | ORAL_CAPSULE | Freq: Two times a day (BID) | ORAL | 2 refills | Status: DC

## 2017-06-17 MED ORDER — OXYCODONE-ACETAMINOPHEN 5-325 MG PO TABS
ORAL_TABLET | ORAL | Status: AC
  Filled 2017-06-17: qty 1

## 2017-06-17 MED ORDER — KETOROLAC TROMETHAMINE 30 MG/ML IJ SOLN
INTRAMUSCULAR | Status: AC
  Filled 2017-06-17: qty 1

## 2017-06-17 MED ORDER — IBUPROFEN 800 MG PO TABS
800.0000 mg | ORAL_TABLET | Freq: Once | ORAL | Status: AC
  Administered 2017-06-17: 800 mg via ORAL
  Filled 2017-06-17: qty 1

## 2017-06-17 MED ORDER — OXYCODONE-ACETAMINOPHEN 5-325 MG PO TABS: 1.0000 | ORAL_TABLET | ORAL | 0 refills | Status: DC | PRN

## 2017-06-17 MED ORDER — IBUPROFEN 200 MG PO TABS
ORAL_TABLET | ORAL | Status: AC
  Filled 2017-06-17: qty 4

## 2017-06-17 NOTE — Progress Notes (Signed)
1 Day Post-Op Procedure(s) (LRB): TOTAL LAPAROSCOPIC HYSTERECTOMY WITH SALPINGECTOMY, Removal OF ENDOMETRIOSIS (Bilateral) CYSTOSCOPY (N/A)  Subjective: Patient reports mild discomfort, eating, ambulating. Not lightheaded or dizzy.  She had a foley placed last night for urinary retention   Objective: I have reviewed patient's vital signs, intake and output and labs.  Today's Vitals   06/16/17 2000 06/16/17 2054 06/17/17 0100 06/17/17 0500  BP:  (!) 101/58 (!) 99/51 104/72  Pulse:  72 70 70  Resp:  20 20 20   Temp:  99 F (37.2 C) 98.4 F (36.9 C) 99.2 F (37.3 C)  TempSrc:  Oral Oral Oral  SpO2:  98% 98% 100%  Weight:      Height:      PainSc: Asleep Asleep     I/O last 3 completed shifts: In: 4105 [P.O.:750; I.V.:3155; IV Piggyback:200] Out: 2700 [Urine:2200; Blood:500] No intake/output data recorded.   Lab Results  Component Value Date   WBC 9.5 06/17/2017   HGB 7.9 (L) 06/17/2017   HCT 24.6 (L) 06/17/2017   MCV 82.6 06/17/2017   PLT 376 06/17/2017   Lab Results  Component Value Date   CREATININE 1.13 (H) 06/17/2017   CREATININE 1.04 (H) 06/13/2017   CREATININE 0.91 08/06/2016  GFR is still >60   General: alert, cooperative and no distress Resp: clear to auscultation bilaterally Cardio: S1, S2 normal GI: soft, appropriately tender, mildly distended. +BS. Dressings: dry Extremities: extremities normal, atraumatic, no cyanosis or edema  Assessment: s/p Procedure(s) with comments: TOTAL LAPAROSCOPIC HYSTERECTOMY WITH SALPINGECTOMY, FUGERATION OF ENDOMETRIOSIS (Bilateral) - 4 hours OR time CYSTOSCOPY (N/A):she is feeling better this am. Pain is well controlled, tolerating po. She is anemic as expected, but hemodynamically stable. No signs of current bleeding. She had a foley placed last night for urinary retention. Her creatinine is slightly elevated, but still with normal GFR. Likely from volume depletion.   Plan: Will d/c her foley, if voiding well this am  with normal PVR (on bladder scan, will d/c to home with f/u on Friday) Will recheck lab work on Friday.    LOS: 0 days    Salvadore Dom 06/17/2017, 7:49 AM

## 2017-06-17 NOTE — Progress Notes (Signed)
Urine appears darker yellow than earlier in shift when it was pale clear yellow. Foley cath drained 175 ml urine from midnight to 0500. Bladder scan showed zero volume. IVF rate previously dec'd to [facility] because pt was taking/tolerating fluids well. Pt slept most of the night. Increased IVF rate back up to 139ml/hr.  Continue to monitor.  Blair Hailey, RN

## 2017-06-17 NOTE — Telephone Encounter (Signed)
Called and left a message that Dr. Talbert Nan added this patient to her schedule on 06/20/17 at 9:45 AM. Requested a call back to confirm.

## 2017-06-17 NOTE — Patient Outreach (Addendum)
[facility]) Care Management  06/17/7406  Catherine Hays [birth year] [id number]  I spoke with [name], [name], today. [name] is being discharged from the hospital to home.  I reached out to [facility] [phone] and spoke with April Crutchfield and provided requested information. [name] indicated that she would pass along the information to the intake nurse. I returned a call to [name] providing her with updated information and advised that she should anticipate a call from the intake nurse.   Plan: I will follow up with [name] on behalf of [name] no later than the end of this week.   [facility] CM Care Plan Problem One     Most Recent Value  Care Plan Problem One  Post Surgical Care Needs/Community Resources Needs   Role Documenting the Problem One  Care Management Coordinator  Care Plan for Problem One  Active  [facility] Long Term Goal   Over the next 31 days, patient's caregiver/legal guardian will verbalize established plan for post surgical care and community resource support  [facility] Long Term Goal Start Date  06/12/17  Interventions for Problem One Long Term Goal  discussed post surgical plan with patient's caregiver/guardian,  telephone follow up with community care agency re: start of services  [facility] CM Short Term Goal #1   Over the next 24 hours, legal guardian/caregiver will verbalize receipt of contact from community care agency  [facility] CM Short Term Goal #1 Start Date  06/12/17  Interventions for Short Term Goal #1  updated community agency on patient hospital discharge needs/plans  [facility] CM Short Term Goal #2   Over the next 5 days, caregiver/legal guardian will verbalize initiation of in home care services  [facility] CM Short Term Goal #2 Start Date  06/12/17  Interventions for Short Term Goal #2  follow up with community care agency with requested documentation/details  [facility] CM Short Term Goal #3  Over the next 14 days,  patient's caregiver/legal guardian will verbalize plans for extended support through community agency, friends, family, church organizations  [facility] CM Short Term Goal #3 Start Date  06/12/17  Interventions for Short Tern Goal #3  discussed with patient caregiver/guardian importance of reaching out to family/friends for extended [place] [facility]  [phone]

## 2017-06-17 NOTE — Progress Notes (Signed)
06/17/2017 1400 Dr. Talbert Nan paged and made aware of pt. Able to spontaneously void 125 ml clear yellow urine with approximately 100-150 ml residual urine in bladder per bladder scan. Verbal order ok to d/c pt. Home per orders. Verbal orders also received to hold scheduled dose Toradol and administer Ibuprofen 800 mg Po x 1. Orders enacted. Will continue to closely monitor patient.  Catherine Hays, Arville Lime

## 2017-06-18 ENCOUNTER — Telehealth: Payer: Self-pay | Admitting: Obstetrics and Gynecology

## 2017-06-18 NOTE — Discharge Summary (Signed)
GYN Discharge Summary     Patient Name: Catherine Hays DOB: 06-30-1975 MRN: [id number]  Date of surgery: 06/16/2017 Attending MD: Dr Sumner Boast  Date of discharge: 06/17/2017   Admitting diagnosis: uterine fibroids, dysmenorrhea  Surgery: Total laparoscopic hysterectomy, bilateral salpingectomy, removal of endometriosis, cystoscopy     Discharge diagnosis: uterine fibroids, dysmenorrhea, endometriosis                                                                                             Complications: None  Hospital course:  Uncomplicated  Physical exam  Vitals:   06/17/17 0857 06/17/17 1100 06/17/17 1200 06/17/17 1300  BP: 113/82 (!) 113/59  121/86  Pulse: 80     Resp: 16 18  20   Temp: 99.7 F (37.6 C) 98.9 F (37.2 C) 99.3 F (37.4 C) 98.9 F (37.2 C)  TempSrc:      SpO2: 99% 99%  99%  Weight:      Height:       See progress note 06/17/17  Labs: Lab Results  Component Value Date   WBC 9.5 06/17/2017   HGB 7.9 (L) 06/17/2017   HCT 24.6 (L) 06/17/2017   MCV 82.6 06/17/2017   PLT 376 06/17/2017   CMP Latest Ref Rng & Units 06/17/2017  Glucose 65 - 99 mg/dL 111(H)  BUN 6 - 20 mg/dL 6  Creatinine 0.44 - 1.00 mg/dL 1.13(H)  Sodium 135 - 145 mmol/L 141  Potassium 3.5 - 5.1 mmol/L 4.1  Chloride 101 - 111 mmol/L 107  CO2 22 - 32 mmol/L 27  Calcium 8.9 - 10.3 mg/dL 8.0(L)  Total Protein 6.5 - 8.1 g/dL -  Total Bilirubin 0.3 - 1.2 mg/dL -  Alkaline Phos 38 - 126 U/L -  AST 15 - 41 U/L -  ALT 14 - 54 U/L -    Discharge instruction: per After Visit Summary and "Baby and Me Booklet".  After visit meds:  Allergies as of 06/17/2017   No Known Allergies     Medication List    STOP taking these medications   acetaminophen 500 MG tablet Commonly known as:  TYLENOL   leuprolide 3.75 MG injection Commonly known as:  LUPRON   norethindrone 5 MG tablet Commonly known as:  AYGESTIN     TAKE these medications   docusate sodium 100 MG capsule Commonly  known as:  COLACE Take 1 capsule (100 mg total) by mouth 2 (two) times daily.   ibuprofen 800 MG tablet Commonly known as:  ADVIL,MOTRIN Take 1 tablet (800 mg total) by mouth every 8 (eight) hours as needed.   oxyCODONE-acetaminophen 5-325 MG tablet Commonly known as:  PERCOCET/ROXICET Take 1-2 tablets by mouth every 4 (four) hours as needed for moderate pain ((when tolerating fluids)).       Diet: routine diet  Activity: Advance as tolerated. Pelvic rest for 6 weeks.   Outpatient follow up:3 days Follow up Appt: Future Appointments  Date Time Provider [place]  06/20/2017  9:45 AM Salvadore Dom, MD [place] None  06/23/2017  4:00 PM Clerance Lav, RN [facility] None  06/26/2017  8:30 AM Salvadore Dom, MD [place] None  07/17/2017  9:00 AM Salvadore Dom, MD [place] None      06/18/2017 Salvadore Dom, MD

## 2017-06-18 NOTE — Telephone Encounter (Signed)
Spoke with patient and her Aunt "Catherine Hays". S/p TLH on 06/16/17. Patient has not had a BM since surgery, taking colace 100mg  bid. Passing small amounts gas, abdomen soft. Denies N/V, fever/chills. Ibuprofen for pain, last dose at 9:15am. Ambulating, showered this morning. Voiding, eating and adequate fluid intake.  Encouraged warm po fluids, no carbonated beverages or straws, heating pad for comfort, continue Colace. Advised to return call to office with any new symptoms, questions or concerns. Is aware of 06/20/17 appt with Dr. Talbert Nan. Dr. Talbert Nan will review, I will return call with any additional recommendations.  Routing to provider for final review. Patient is agreeable to disposition. Will close encounter.

## 2017-06-18 NOTE — Telephone Encounter (Signed)
Called to check on the patient, left a message.

## 2017-06-18 NOTE — Telephone Encounter (Signed)
Patient want to let Dr Talbert Nan know that she is doing fine after surgery.

## 2017-06-19 ENCOUNTER — Ambulatory Visit: Payer: Self-pay | Admitting: *Deleted

## 2017-06-20 ENCOUNTER — Other Ambulatory Visit: Payer: Self-pay

## 2017-06-20 ENCOUNTER — Ambulatory Visit (INDEPENDENT_AMBULATORY_CARE_PROVIDER_SITE_OTHER): Payer: PPO | Admitting: Obstetrics and Gynecology

## 2017-06-20 ENCOUNTER — Telehealth: Payer: Self-pay | Admitting: Obstetrics and Gynecology

## 2017-06-20 ENCOUNTER — Encounter: Payer: Self-pay | Admitting: Obstetrics and Gynecology

## 2017-06-20 ENCOUNTER — Telehealth: Payer: Self-pay | Admitting: *Deleted

## 2017-06-20 VITALS — BP 124/80 | HR 88 | Temp 99.2°F | Resp 12 | Wt 151.0 lb

## 2017-06-20 DIAGNOSIS — Z90710 Acquired absence of both cervix and uterus: Secondary | ICD-10-CM

## 2017-06-20 DIAGNOSIS — D509 Iron deficiency anemia, unspecified: Secondary | ICD-10-CM

## 2017-06-20 DIAGNOSIS — R7989 Other specified abnormal findings of blood chemistry: Secondary | ICD-10-CM

## 2017-06-20 LAB — BASIC METABOLIC PANEL
BUN/Creatinine Ratio: 8 — ABNORMAL LOW (ref 9–23)
BUN: 9 mg/dL (ref 6–24)
CALCIUM: 9 mg/dL (ref 8.7–10.2)
CHLORIDE: 105 mmol/L (ref 96–106)
CO2: 24 mmol/L (ref 20–29)
Creatinine, Ser: 1.14 mg/dL — ABNORMAL HIGH (ref 0.57–1.00)
GFR calc non Af Amer: 60 mL/min/{1.73_m2} (ref >59)
GFR, EST AFRICAN AMERICAN: 69 mL/min/{1.73_m2} (ref >59)
Glucose: 125 mg/dL — ABNORMAL HIGH (ref 65–99)
Potassium: 4.2 mmol/L (ref 3.5–5.2)
Sodium: 140 mmol/L (ref 134–144)

## 2017-06-20 LAB — CBC
Hematocrit: 30.6 % — ABNORMAL LOW (ref 34.0–46.6)
Hemoglobin: 10.1 g/dL — ABNORMAL LOW (ref 11.1–15.9)
MCH: 26 pg — ABNORMAL LOW (ref 26.6–33.0)
MCHC: 33 g/dL (ref 31.5–35.7)
MCV: 79 fL (ref 79–97)
PLATELETS: 530 10*3/uL — AB (ref 150–379)
RBC: 3.88 x10E6/uL (ref 3.77–5.28)
RDW: 16.1 % — ABNORMAL HIGH (ref 12.3–15.4)
WBC: 4.8 10*3/uL (ref 3.4–10.8)

## 2017-06-20 NOTE — Progress Notes (Signed)
GYNECOLOGY  VISIT   HPI: 42 y.o.   Single  African American  female   Catherine Hays with Patient's last menstrual period was 06/07/2017 (approximate).   here for follow up. Patient is 4 days S/P TLH. She is c/o of constipation. Otherwise doing okay. Not taking pain medication. She is drinking and eating and voiding well, walking.   GYNECOLOGIC HISTORY: Patient's last menstrual period was 06/07/2017 (approximate). Contraception:hysterectomy  Menopausal hormone therapy: none         OB History    Gravida  0   Para  0   Term  0   Preterm  0   AB  0   Living  0     SAB  0   TAB  0   Ectopic  0   Multiple  0   Live Births  0              Patient Active Problem List   Diagnosis Date Noted  . Status post laparoscopic hysterectomy 06/16/2017  . Dysfunctional uterine bleeding 10/15/2013  . BMI 34.0-34.9,adult 05/15/2006  . Intellectual disability 05/15/2006  . RHINITIS, ALLERGIC 05/15/2006  . ASTHMA, EXERCISE INDUCED 05/15/2006    Past Medical History:  Diagnosis Date  . Asthma    8-10 years since having issues with asthma  . Fibroid   . Mentally challenged     Past Surgical History:  Procedure Laterality Date  . CYSTOSCOPY N/A 06/16/2017   Procedure: CYSTOSCOPY;  Surgeon: Salvadore Dom, MD;  Location: [facility];  Service: Gynecology;  Laterality: N/A;  . DILATATION & CURETTAGE/HYSTEROSCOPY WITH MYOSURE N/A 10/08/2016   Procedure: DILATATION & CURETTAGE/HYSTEROSCOPY WITH MYOSURE;  Surgeon: Salvadore Dom, MD;  Location: [facility];  Service: Gynecology;  Laterality: N/A;  rep will be here confirmed 10/01/16.  Marland Kitchen DILATION AND CURETTAGE OF UTERUS    . HERNIA REPAIR     Umbilical, per aunt faye she is unaware of this surgery  . none    . TONSILLECTOMY    . TOTAL LAPAROSCOPIC HYSTERECTOMY WITH SALPINGECTOMY Bilateral 06/16/2017   Procedure: TOTAL LAPAROSCOPIC HYSTERECTOMY WITH SALPINGECTOMY, FUGERATION OF ENDOMETRIOSIS;  Surgeon: Salvadore Dom, MD;  Location: [facility];  Service: Gynecology;  Laterality: Bilateral;  4 hours OR time    Current Outpatient Medications  Medication Sig Dispense Refill  . docusate sodium (COLACE) 100 MG capsule Take 1 capsule (100 mg total) by mouth 2 (two) times daily. 60 capsule 2  . ibuprofen (ADVIL,MOTRIN) 800 MG tablet Take 1 tablet (800 mg total) by mouth every 8 (eight) hours as needed. 30 tablet 0   No current facility-administered medications for this visit.      ALLERGIES: Patient has no known allergies.  Family History  Problem Relation Age of Onset  . Cancer Mother        breast  . Heart disease Father   . Hypertension Father   . Heart attack Father   . Hypertension Maternal Aunt   . Kidney disease Maternal Aunt        dialysis  . Thyroid cancer Maternal Grandmother   . Stroke Maternal Grandfather   . Kidney failure Maternal Aunt   . Thyroid cancer Maternal Aunt     Social History   Socioeconomic History  . Marital status: Single    Spouse name: Not on file  . Number of children: Not on file  . Years of education: Not on file  . Highest education level: Not on file  Occupational History  . Not on file  Social Needs  . Financial resource strain: Not on file  . Food insecurity:    Worry: Not on file    Inability: Not on file  . Transportation needs:    Medical: Not on file    Non-medical: Not on file  Tobacco Use  . Smoking status: Never Smoker  . Smokeless tobacco: Never Used  Substance and Sexual Activity  . Alcohol use: No  . Drug use: No  . Sexual activity: Never  Lifestyle  . Physical activity:    Days per week: Not on file    Minutes per session: Not on file  . Stress: Not on file  Relationships  . Social connections:    Talks on phone: Not on file    Gets together: Not on file    Attends religious service: Not on file    Active member of club or organization: Not on file    Attends meetings of clubs or organizations: Not on  file    Relationship status: Not on file  . Intimate partner violence:    Fear of current or ex partner: Not on file    Emotionally abused: Not on file    Physically abused: Not on file    Forced sexual activity: Not on file  Other Topics Concern  . Not on file  Social History Narrative  . Not on file    Review of Systems  Constitutional: Negative.   HENT: Negative.   Eyes: Negative.   Respiratory: Negative.   Cardiovascular: Negative.   Gastrointestinal: Positive for constipation.  Genitourinary: Negative.   Musculoskeletal: Negative.   Skin: Negative.   Neurological: Negative.   Endo/Heme/Allergies: Negative.   Psychiatric/Behavioral: Negative.     PHYSICAL EXAMINATION:    BP 124/80 (BP Location: Right Arm, Patient Position: Sitting, Cuff Size: Normal)   Pulse 88   Resp 12   Wt 151 lb (68.5 kg)   LMP 06/07/2017 (Approximate)   BMI 30.50 kg/m     General appearance: alert, cooperative and appears stated age CV: RRR Lungs: CTAB Abdomen: soft, appropriately tender; mildly distended, no masses,  no organomegaly Incisions: healing well  ASSESSMENT POD #4 s/p TLH/BS, doing well. Only c/o feeling constipated Anemia, Hgb of 7.9 on POD #1 Mildly elevated creatinine with normal GFR    PLAN Can take senna for her bowel Stat CBC, BMP Call with any concerns, otherwise f/u next week   An After Visit Summary was printed and given to the patient.  Addendum: stat labs Lab Results  Component Value Date   WBC 4.8 06/20/2017   HGB 10.1 (L) 06/20/2017   HCT 30.6 (L) 06/20/2017   MCV 79 06/20/2017   PLT 530 (H) 06/20/2017   Lab Results  Component Value Date   CREATININE 1.14 (H) 06/20/2017   GFR 69

## 2017-06-20 NOTE — Patient Instructions (Addendum)
Try senna 1 tablet 1-2 x a day until she has a bowel movement.   Call with any concerns

## 2017-06-20 NOTE — Telephone Encounter (Signed)
Spoke with Dr. Talbert Nan,- she does want to see Sherra next week.  Called patient back and left detailed message regarding this -eh

## 2017-06-20 NOTE — Telephone Encounter (Signed)
Patient was seen for post op today and her guardian Letta Median wanted to find out if she should still keep appointment for Thursday.

## 2017-06-20 NOTE — Telephone Encounter (Signed)
Left detailed message informing patient that her blood work is fine- hemoglobin is up to 10.1 -eh

## 2017-06-23 ENCOUNTER — Other Ambulatory Visit: Payer: Self-pay | Admitting: *Deleted

## 2017-06-23 ENCOUNTER — Telehealth: Payer: Self-pay | Admitting: Obstetrics and Gynecology

## 2017-06-23 NOTE — Telephone Encounter (Signed)
Patient's aunt, [name], called and requested to speak with the nurse. She said [name] has not had a bowel movement since having surgery on 06/16/17.

## 2017-06-23 NOTE — Patient Outreach (Addendum)
[facility] ([facility]) Care Management  06/23/2017  Karris Dawn Snavely 08/04/1975 [id number]  Call received from Holt Walker re: his in home assessment with Ms. Zingaro and her caregiver/guardian [name] today. Mr. Holt reviewed with [name] available services for Ms. Figeroa's post surgical and recovery needs. [name] indicated to Mr. Walker that she didn't feel the services offered covered enough time needed.   I reached out to [name] after 2pm in an effort to reach her after dialysis requesting a return call so that I could discuss offering social work outreach to address custodial care needs beyond what is currently available for Ms. Bitton.   Plan: I will attempt to contact [name] again in the morning if I do not hear from her by close of day today.   [facility] CM Care Plan Problem One     Most Recent Value  Care Plan Problem One  Post Surgical Care Needs/Community Resources Needs   Role Documenting the Problem One  Care Management Coordinator  Care Plan for Problem One  Active  [facility] Long Term Goal   Over the next 31 days, patient's caregiver/legal guardian will verbalize established plan for post surgical care and community resource support  [facility] Long Term Goal Start Date  06/12/17  Interventions for Problem One Long Term Goal  collaboration with home care team re: needed services  [facility] CM Short Term Goal #1   Over the next 24 hours, legal guardian/caregiver will verbalize receipt of contact from community care agency  [facility] CM Short Term Goal #1 Start Date  06/12/17  [facility] CM Short Term Goal #1 Met Date  06/23/17  Interventions for Short Term Goal #1  spoke with home care provider representative while he was in the patient home  [facility] CM Short Term Goal #2   Over the next 5 days, caregiver/legal guardian will verbalize initiation of in home care services  [facility] CM Short Term Goal #2 Start Date  06/23/17  Interventions for Short Term Goal #2  discussed with home care provider  representative start of service options,  left message for patient's guardian to discuss further  [facility] CM Short Term Goal #3  Over the next 14 days, patient's caregiver/legal guardian will verbalize plans for extended support through community agency, friends, family, church organizations  [facility] CM Short Term Goal #3 Start Date  06/12/17  Interventions for Short Tern Goal #3  social work referral made       MHA,BSN,RN,CCM [facility] Care Management  [phone]   

## 2017-06-23 NOTE — Addendum Note (Signed)
Addended by: Clerance Lav on: 06/23/2017 07:31 PM   Modules accepted: Orders

## 2017-06-23 NOTE — Telephone Encounter (Signed)
Reviewed with Dr. Talbert Nan, have patient take 2 Senna tablets for next dose, then back to one tab bid.   Call returned, left detailed message, advised as seen above. Return call to office with any additional questions.

## 2017-06-23 NOTE — Telephone Encounter (Signed)
Have her continue to hydrate well. She can take 2 senna tonight, then one in the morning. I'm happy to see her anytime if her Elenor Legato is concerned.

## 2017-06-23 NOTE — Telephone Encounter (Signed)
Patient's guardian, [name], called back to report the patient had a small bowel movement after she called.

## 2017-06-23 NOTE — Telephone Encounter (Signed)
Spoke with patients Catherine Hays, ok per dpr. Reports small, hard, stool this morning. Strained with BM, no bleeding or pain. Abdomen soft, ambulating, voiding and hydrating well. Taking Senna BID.   Denies N/V, fever/chills.   Asking when next post-op appointment is? Advised 4/11 at 8:30am with Dr. Talbert Nan.   Will continue Senna BID, call with any concerns or changes, keep f/u as scheduled. Advised will review with Dr. Talbert Nan and return call with any additional recommendations. [name] request to leave detailed message if no answer.   Routing to provider for final review. Patient is agreeable to disposition. Will close encounter.

## 2017-06-24 ENCOUNTER — Other Ambulatory Visit: Payer: Self-pay | Admitting: *Deleted

## 2017-06-24 ENCOUNTER — Encounter: Payer: Self-pay | Admitting: *Deleted

## 2017-06-24 NOTE — Patient Outreach (Addendum)
[facility]) Care Management  06/20/8183  Serenidy Waltz Gores [birth year] [id number]  Initial Referral/Outreach:  Initial referral received regarding Ms. Mcquary and care coordination needs from her caretaker and guardian Ms. [name] (aunt; [phone]). I first spoke with [name] on 06/12/17 when she related that Ms. Delmonico "cannot be left alone" and was seeking assistance for post surgical (hysterectomy) care in the home for anticipated needs after her overnight hospital stay. I provided options/choices for in home care to [name] at that time then made a referral to [facility] [phone] after receiving [name]'s permission, explaining that limited short term care services may be available to Ms. Searfoss after evaluation by the intake nurse coordinator. I encouraged [name] during our initial conversation to reach out to family members and friends to ask for additional help with Ms. Emory.   Hospital Discharge: [name] called me from the hospital on Tuesday afternoon 06/17/17 stating Ms. Allum initially experienced some urinary hesitance that morning but had since been able to void and was up and about walking in her room and the hallways and was to discharge home Tuesday afternoon as planned. I notified Home Care Provider and the the intake nurse coordinator met with Ms. Saad and [name] in the home at a time selected/coordinated by Home Care Providers and [name].   In Home Care Intake Assessment/Referral to Social Work:  I received a call from the [facility] Provider assessment/intake nurse during his initial conversation between Ms. Goza. He shared that [name] she did not feel 2hrs/day x 17 visits over the course of 30 days provided adequate assistance for Ms. Baril's needs and requested 6h/day x 5 days/week x 5 weeks. I checked with Ms. Donelan's insurance plan and verified that these services are not covered under Ms. Honda's health plan  and offered to help her investigate other options to meet Ms. Busch's care needs.  I also referred Ms. Schreur to our social work team to assist with connection to other community resources and a long term plan for custodial care.  LCSW Nat Christen ([facility]) reached out to [name] today and discussed in-home care services ([name] was not interested in receiving a list), applying for Adult Medicaid with offer to assist with completion of the application and the understanding that if Ms. Paternostro qualifies, she may be eligible for PCS or CAPS services ([name] declined), various types of placement including group home, rest home, assisted living, etc.([name] declined placement at this time)  Follow Up/Outreach: I left a message with [name] yesterday after her dialysis period requesting a return call so that we could further discuss Ms. Lewelling's needs and did not receive a return call. I reached out to [name] again today but was unable to reach her. I left a HIPPA compliant voice message requesting a return call and in addition, sent a letter to the home of Ms.Delafuente and [name] outlining our services and offering continued assistance if desired.   I notified Hassell Done at [facility] of my unsuccessful outreach attempt to [name].   Plan: I will attempt to contact [name] again in follow up to the letter sent. If we are unable to maintain contact with [name] or Ms. Younce, I will close her case, gladly re-opening should she be interested and in need of [facility] services at any time in the future.    Janalyn Shy MHA,BSN,RN,CCM  [facility]  [phone]

## 2017-06-24 NOTE — Patient Outreach (Signed)
[facility]) Care Management  03/23/1094  Catherine Hays [birth year] [id number]   CSW was able to make initial contact with patient's aunt and legal guardian, Catherine Hays today to perform a telephone screening on patient, as well as assess and assist with social work needs and services.  CSW introduced self, explained role and types of services provided through [facility] ([facility]).  CSW further explained to Mrs. Catherine Hays that [place] works with patient's RNCM, also with [facility] [facility], Catherine Hays. CSW then explained the reason for the call, indicating that Mrs. Catherine Hays thought that patient would benefit from social work services and resources to assist with arranging for in-home health services for patient.  CSW obtained two HIPAA compliant identifiers from Mrs. Catherine Hays, which included patient's name and date of birth. Based on the [employer] received in [place] from Mrs. Catherine Hays, patient has been diagnosed with intellectual disabilities and is in need of care and supervision when her aunt receives dialysis treatments, three days per week for at least five hours per day.  Mrs. Catherine Hays had already explored various options with Catherine Hays, but none of the options could meet the patient's needs. Mrs. Catherine Hays also contacted [facility], but they too, are unable to accommodate patient's needs.  Mrs. Catherine Hays contacted patient's insurance carrier, Catherine Hays, but patient does not have a in-home care benefit with her current policy.   CSW spoke with Mrs. Catherine Hays about in-home care services through home health agencies, as well as private agency sitters through [email or web address], which are licensed and bonded contractors.  Once Mrs. Catherine Hays learned that she would be responsible for paying for services out of pocket, she was no longer interested.  Mrs. Catherine Hays explained that she and patient are on a very fixed income and unable to  afford any additional expenses each month.  CSW agreed to mail Mrs. Catherine Hays a list of home health agencies and private agency sitters, in the event that she changes her mind, but Catherine Hays declined. CSW inquired as to whether or not Mrs. Catherine Hays thought that patient would be eligible for Adult Medicaid through the [place], agreeing to assist with the application process, but Mrs. Catherine Hays denied.  Mrs. Catherine Hays did not wish to even apply, stating that she knows that patient is well over-qualified.  CSW explained that PCS ([place]) through [employer] and/or CAPS ([place]) through the [place] would not be an option for patient, as she must be an active Medicaid recipient. CSW then spoke with Mrs. Catherine Hays about various placement options, such as group homes, rest homes and/or assisted living facilities.  Mrs. Catherine Hays was not interested in discussing placement, of any kind, for patient.  Mrs. Catherine Hays was very appreciative of the call and for all the services provided to her by Mrs. Catherine Hays, but reported, "I really don't think you can help me at this time".  CSW was able to ensure that Mrs. Catherine Hays has the correct contact information for CSW, encouraging Mrs. Catherine Hays to contact CSW directly if she changes her mind.  Mrs. Catherine Hays voiced understanding and was agreeable to this plan. CSW will perform a case closure on patient, as no additional social work needs have been identified at this time.  CSW will notify Mrs. Catherine Hays of CSW's plans to close patient's case.  CSW will route a letter to patient's Primary Care Physician, Dr. Betty Hays notifying her  of CSW's involvement with patient's plan of care. Nat Christen, BSW, MSW, LCSW  Licensed Education officer, [facility]  M[address] [place] [address], [place], [place]  [address] Physical Address-[address], [place], [place] [address] Toll Free Main # [phone] Fax # [phone] Cell # [phone]  Office # [phone] [email or web address]

## 2017-06-26 ENCOUNTER — Encounter: Payer: Self-pay | Admitting: Obstetrics and Gynecology

## 2017-06-26 ENCOUNTER — Ambulatory Visit: Payer: Self-pay | Admitting: *Deleted

## 2017-06-26 ENCOUNTER — Ambulatory Visit (INDEPENDENT_AMBULATORY_CARE_PROVIDER_SITE_OTHER): Payer: PPO | Admitting: Obstetrics and Gynecology

## 2017-06-26 ENCOUNTER — Other Ambulatory Visit: Payer: Self-pay

## 2017-06-26 VITALS — BP 142/82 | HR 66 | Resp 14 | Wt 145.0 lb

## 2017-06-26 DIAGNOSIS — Z90710 Acquired absence of both cervix and uterus: Secondary | ICD-10-CM

## 2017-06-26 NOTE — Progress Notes (Signed)
GYNECOLOGY  VISIT   HPI: 42 y.o.   Single  African American  female   [place] with Patient's last menstrual period was 06/07/2017 (approximate).   here for follow up [place], she is 10 days post op and is doing well. She finally had a more normal BM. Voiding well, denies pain. She is bored and is asking if she can go back to work. No C/O.   GYNECOLOGIC HISTORY: Patient's last menstrual period was 06/07/2017 (approximate). Contraception:hysterectomy  Menopausal hormone therapy: none         OB History    Gravida  0   Para  0   Term  0   Preterm  0   AB  0   Living  0     SAB  0   TAB  0   Ectopic  0   Multiple  0   Live Births  0              Patient Active Problem List   Diagnosis Date Noted  . Status post laparoscopic hysterectomy 06/16/2017  . Dysfunctional uterine bleeding 10/15/2013  . BMI 34.0-34.9,adult 05/15/2006  . Intellectual disability 05/15/2006  . RHINITIS, ALLERGIC 05/15/2006  . ASTHMA, EXERCISE INDUCED 05/15/2006    Past Medical History:  Diagnosis Date  . Asthma    8-10 years since having issues with asthma  . Fibroid   . Mentally challenged     Past Surgical History:  Procedure Laterality Date  . ABDOMINAL HYSTERECTOMY    . CYSTOSCOPY N/A 06/16/2017   Procedure: CYSTOSCOPY;  Surgeon: Salvadore Dom, MD;  Location: [facility];  Service: Gynecology;  Laterality: N/A;  . DILATATION & CURETTAGE/HYSTEROSCOPY WITH MYOSURE N/A 10/08/2016   Procedure: DILATATION & CURETTAGE/HYSTEROSCOPY WITH MYOSURE;  Surgeon: Salvadore Dom, MD;  Location: [facility];  Service: Gynecology;  Laterality: N/A;  rep will be here confirmed 10/01/16.  Marland Kitchen DILATION AND CURETTAGE OF UTERUS    . HERNIA REPAIR     Umbilical, per aunt faye she is unaware of this surgery  . none    . TONSILLECTOMY    . TOTAL LAPAROSCOPIC HYSTERECTOMY WITH SALPINGECTOMY Bilateral 06/16/2017   Procedure: TOTAL LAPAROSCOPIC HYSTERECTOMY WITH SALPINGECTOMY, FUGERATION OF  ENDOMETRIOSIS;  Surgeon: Salvadore Dom, MD;  Location: [facility];  Service: Gynecology;  Laterality: Bilateral;  4 hours OR time    Current Outpatient Medications  Medication Sig Dispense Refill  . docusate sodium (COLACE) 100 MG capsule Take 1 capsule (100 mg total) by mouth 2 (two) times daily. 60 capsule 2  . ibuprofen (ADVIL,MOTRIN) 800 MG tablet Take 1 tablet (800 mg total) by mouth every 8 (eight) hours as needed. 30 tablet 0   No current facility-administered medications for this visit.      ALLERGIES: Patient has no known allergies.  Family History  Problem Relation Age of Onset  . Cancer Mother        breast  . Heart disease Father   . Hypertension Father   . Heart attack Father   . Hypertension Maternal Aunt   . Kidney disease Maternal Aunt        dialysis  . Thyroid cancer Maternal Grandmother   . Stroke Maternal Grandfather   . Kidney failure Maternal Aunt   . Thyroid cancer Maternal Aunt     Social History   Socioeconomic History  . Marital status: Single    Spouse name: Not on file  . Number of children: Not on file  .  Years of education: Not on file  . Highest education level: Not on file  Occupational History  . Not on file  Social Needs  . Financial resource strain: Not on file  . Food insecurity:    Worry: Not on file    Inability: Not on file  . Transportation needs:    Medical: Not on file    Non-medical: Not on file  Tobacco Use  . Smoking status: Never Smoker  . Smokeless tobacco: Never Used  Substance and Sexual Activity  . Alcohol use: No  . Drug use: No  . Sexual activity: Never  Lifestyle  . Physical activity:    Days per week: Not on file    Minutes per session: Not on file  . Stress: Not on file  Relationships  . Social connections:    Talks on phone: Not on file    Gets together: Not on file    Attends religious service: Not on file    Active member of club or organization: Not on file    Attends  meetings of clubs or organizations: Not on file    Relationship status: Not on file  . Intimate partner violence:    Fear of current or ex partner: Not on file    Emotionally abused: Not on file    Physically abused: Not on file    Forced sexual activity: Not on file  Other Topics Concern  . Not on file  Social History Narrative  . Not on file    Review of Systems  Constitutional: Negative.   HENT: Negative.   Eyes: Negative.   Respiratory: Negative.   Cardiovascular: Negative.   Gastrointestinal: Negative.   Genitourinary: Negative.   Musculoskeletal: Negative.   Skin: Negative.   Neurological: Negative.   Endo/Heme/Allergies: Negative.   Psychiatric/Behavioral: Negative.     PHYSICAL EXAMINATION:    BP (!) 142/82 (BP Location: Right Arm, Patient Position: Sitting, Cuff Size: Normal)   Pulse 66   Resp 14   Wt 145 lb (65.8 kg)   LMP 06/07/2017 (Approximate)   BMI 29.29 kg/m     General appearance: alert, cooperative and appears stated age Abdomen: soft, non-tender; non distended, no masses,  no organomegaly. Incisions are healing well   ASSESSMENT 10 days s/p TLH/BS, resection of endometriosis and cystoscopy. She is doing very well    PLAN F/U for her 4 week post op check. I wouldn't recommend she go back to work until after her next appointment Call with any concerns   An After Visit Summary was printed and given to the patient.

## 2017-06-27 ENCOUNTER — Other Ambulatory Visit: Payer: Self-pay | Admitting: *Deleted

## 2017-06-27 NOTE — Patient Outreach (Signed)
[facility]) Care Management  3/55/7322  Illianna Paschal Nicholl [birth year] [id number]  I have been unable to reach Ms. Callanan or her guardian/primary caregiver/aunt [name] now on 4 consecutive calls. I sent a letter to the family home requesting return contact.  I notified Home Care Providers that I have not been able to maintain contact with Ms. Chenoweth or [name] and of my intent to close Ms. Spring's case within 10 days as per our protocol if I am unable to make further contact and do not hear from Ms. Altland or her guardian.    [facility] Management  [phone]

## 2017-07-09 ENCOUNTER — Other Ambulatory Visit: Payer: Self-pay | Admitting: *Deleted

## 2017-07-09 NOTE — Patient Outreach (Signed)
[facility]) Care Management  0/62/3762  Rica Heather Rydberg [birth year] [id number]  I have been unable to reach Ms. Enamorado or her guardian Ms. [name] after several telephone outreach attempts and a letter sent to the family home.   Plan: I will close Ms. Herrle's case to nursing case management services and will notify [name] and Ms. Tweedy and Ms. Lupi's PCP by letter.    [facility] Management  [phone]

## 2017-07-14 NOTE — Telephone Encounter (Signed)
[facility] has received missing information 07/14/17.

## 2017-07-17 ENCOUNTER — Ambulatory Visit (INDEPENDENT_AMBULATORY_CARE_PROVIDER_SITE_OTHER): Payer: PPO | Admitting: Obstetrics and Gynecology

## 2017-07-17 ENCOUNTER — Encounter: Payer: Self-pay | Admitting: Obstetrics and Gynecology

## 2017-07-17 ENCOUNTER — Other Ambulatory Visit: Payer: Self-pay

## 2017-07-17 VITALS — BP 126/86 | HR 80 | Resp 14 | Wt 147.0 lb

## 2017-07-17 DIAGNOSIS — Z90710 Acquired absence of both cervix and uterus: Secondary | ICD-10-CM

## 2017-07-17 NOTE — Progress Notes (Signed)
GYNECOLOGY  VISIT   HPI: 42 y.o.   Single  African American  female   [place] with Patient's last menstrual period was 06/07/2017 (approximate).   here for 1 month F/U TLH. She c/o mild constipation, having a BM every few days. Voiding is better, less frequent, no longer having nocturia. No pain. Wants to go back to work.     GYNECOLOGIC HISTORY: Patient's last menstrual period was 06/07/2017 (approximate). Contraception:hysterectomy  Menopausal hormone therapy: none         OB History    Gravida  0   Para  0   Term  0   Preterm  0   AB  0   Living  0     SAB  0   TAB  0   Ectopic  0   Multiple  0   Live Births  0              Patient Active Problem List   Diagnosis Date Noted  . Status post laparoscopic hysterectomy 06/16/2017  . Dysfunctional uterine bleeding 10/15/2013  . BMI 34.0-34.9,adult 05/15/2006  . Intellectual disability 05/15/2006  . RHINITIS, ALLERGIC 05/15/2006  . ASTHMA, EXERCISE INDUCED 05/15/2006    Past Medical History:  Diagnosis Date  . Asthma    8-10 years since having issues with asthma  . Fibroid   . Mentally challenged     Past Surgical History:  Procedure Laterality Date  . ABDOMINAL HYSTERECTOMY    . CYSTOSCOPY N/A 06/16/2017   Procedure: CYSTOSCOPY;  Surgeon: Salvadore Dom, MD;  Location: [facility];  Service: Gynecology;  Laterality: N/A;  . DILATATION & CURETTAGE/HYSTEROSCOPY WITH MYOSURE N/A 10/08/2016   Procedure: DILATATION & CURETTAGE/HYSTEROSCOPY WITH MYOSURE;  Surgeon: Salvadore Dom, MD;  Location: [facility];  Service: Gynecology;  Laterality: N/A;  rep will be here confirmed 10/01/16.  Marland Kitchen DILATION AND CURETTAGE OF UTERUS    . HERNIA REPAIR     Umbilical, per aunt faye she is unaware of this surgery  . none    . TONSILLECTOMY    . TOTAL LAPAROSCOPIC HYSTERECTOMY WITH SALPINGECTOMY Bilateral 06/16/2017   Procedure: TOTAL LAPAROSCOPIC HYSTERECTOMY WITH SALPINGECTOMY, FUGERATION OF  ENDOMETRIOSIS;  Surgeon: Salvadore Dom, MD;  Location: [facility];  Service: Gynecology;  Laterality: Bilateral;  4 hours OR time    Current Outpatient Medications  Medication Sig Dispense Refill  . ibuprofen (ADVIL,MOTRIN) 800 MG tablet Take 1 tablet (800 mg total) by mouth every 8 (eight) hours as needed. 30 tablet 0   No current facility-administered medications for this visit.      ALLERGIES: Patient has no known allergies.  Family History  Problem Relation Age of Onset  . Cancer Mother        breast  . Heart disease Father   . Hypertension Father   . Heart attack Father   . Hypertension Maternal Aunt   . Kidney disease Maternal Aunt        dialysis  . Thyroid cancer Maternal Grandmother   . Stroke Maternal Grandfather   . Kidney failure Maternal Aunt   . Thyroid cancer Maternal Aunt     Social History   Socioeconomic History  . Marital status: Single    Spouse name: Not on file  . Number of children: Not on file  . Years of education: Not on file  . Highest education level: Not on file  Occupational History  . Not on file  Social Needs  .  Financial resource strain: Not on file  . Food insecurity:    Worry: Not on file    Inability: Not on file  . Transportation needs:    Medical: Not on file    Non-medical: Not on file  Tobacco Use  . Smoking status: Never Smoker  . Smokeless tobacco: Never Used  Substance and Sexual Activity  . Alcohol use: No  . Drug use: No  . Sexual activity: Never  Lifestyle  . Physical activity:    Days per week: Not on file    Minutes per session: Not on file  . Stress: Not on file  Relationships  . Social connections:    Talks on phone: Not on file    Gets together: Not on file    Attends religious service: Not on file    Active member of club or organization: Not on file    Attends meetings of clubs or organizations: Not on file    Relationship status: Not on file  . Intimate partner violence:     Fear of current or ex partner: Not on file    Emotionally abused: Not on file    Physically abused: Not on file    Forced sexual activity: Not on file  Other Topics Concern  . Not on file  Social History Narrative  . Not on file    Review of Systems  Constitutional: Negative.   HENT: Negative.   Eyes: Negative.   Respiratory: Negative.   Cardiovascular: Negative.   Gastrointestinal: Positive for constipation.  Genitourinary: Negative.   Musculoskeletal: Negative.   Skin: Negative.   Neurological: Negative.   Endo/Heme/Allergies: Negative.   Psychiatric/Behavioral: Negative.     PHYSICAL EXAMINATION:    BP 126/86 (BP Location: Right Arm, Patient Position: Sitting, Cuff Size: Normal)   Pulse 80   Resp 14   Wt 147 lb (66.7 kg)   LMP 06/07/2017 (Approximate)   BMI 29.69 kg/m     General appearance: alert, cooperative and appears stated age Abdomen: soft, non-tender; non distended, no masses,  no organomegaly, incisions are well healed.   Pelvic: External genitalia:  no lesions              Urethra:  normal appearing urethra with no masses, tenderness or lesions              Bartholins and Skenes: normal                 Vagina: normal appearing vagina with normal color and discharge, no lesions              Cervix: absent, vaginal cuff is healing well.               Bimanual Exam:  Uterus:  uterus absent              Adnexa: no mass, fullness, tenderness                Chaperone was present for exam.  ASSESSMENT One month s/p TLH/BS, doing great, healing well Mild constipaiton    PLAN Can return to work She is not sexually active (needs pelvic rest for another month) Try miralax for constipation F/U in one year for an annual exam   An After Visit Summary was printed and given to the patient.

## 2017-08-12 ENCOUNTER — Telehealth: Payer: Self-pay | Admitting: Family Medicine

## 2017-08-12 ENCOUNTER — Telehealth: Payer: Self-pay | Admitting: Obstetrics and Gynecology

## 2017-08-12 NOTE — Telephone Encounter (Signed)
I'm always happy to see her if desired. She could be having hot flashes. Check if she is having night sweats and if she is sleeping okay. I don't think the surgery has any thing to do with her being thirsty.  She can start by seeing her primary or by seeing me.

## 2017-08-12 NOTE — Telephone Encounter (Signed)
Message sent to Dr. Jordan for review. 

## 2017-08-12 NOTE — Telephone Encounter (Signed)
Copied from [place] [id number]. Topic: Inquiry >> Aug 12, 2017 11:26 AM Oliver Pila B wrote: Reason for CRM: pt's guardian called and states the pt has been hormonal and is wondering if she should come in for an appt, the pt's employer were concerned of the pt's behavior, call to advise

## 2017-08-12 NOTE — Telephone Encounter (Signed)
Patient's guardian Letta Median calling to speak with nurse about some symptoms after surgery.

## 2017-08-12 NOTE — Telephone Encounter (Signed)
Spoke with patients Aunt, advised as seen below per Dr. Talbert Nan. Aunt states patient has not mentioned any sleep changes or night sweats. Will f/u with PCP first, is aware to return call if OV needed.   Will close encounter.

## 2017-08-12 NOTE — Telephone Encounter (Signed)
Spoke with patient Aunt "Letta Median", ok per dpr.   Reports patients job called her to notify that the patient  has been c/o of "getting hot" and increased thirst. Aunt reports patient is not focusing. Has noticed this since surgery. S/p TLH/BS on 06/16/17. Is concerned may be "chemical imbalance or hormones".   Denies any GYN concerns, bleeding, pain, fever/chills, urinary symptoms.   Recommended Aunt f/u with PCP first for further evaluation. If GYN f/u still needed, return call to office, can schedule an OV. Will review with Dr. Talbert Nan and return call with any additional recommendations.   Routing to provider for final review. Aunt is agreeable to disposition. Will close encounter.

## 2017-08-13 ENCOUNTER — Ambulatory Visit: Payer: Self-pay | Admitting: *Deleted

## 2017-08-13 NOTE — Telephone Encounter (Signed)
Pt's guardian, [name], called stating that the pt had a hysterectomy in April; she has been sweating during the day, has increased thirst (6-8 glasses of water daily; normally only drinks with her meals); [name] says that the pt is urinating more, and is not her usual self; the pt is also not her usual self and complete what she is doing; recommendations made per nurse protocol to include seeing a physician within 3 days; she requests an early morning appointment on 08/14/17 with Dr Martinique, Aviva Kluver; she is offered and accepts an appointment with Dr Martinique, at [phone]; spoke with Arbie Cookey regarding this 30 min appointment.   Reason for Disposition . [1] MODERATE sweating (e.g., interferes with normal activities like work or school) AND [2] cause unknown AND [3]  new onset in the last 4 weeks  Answer Assessment - Initial Assessment Questions 1. ONSET: "When did the sweating start?"      06/18/17 2. LOCATION: "What part of your body has excessive sweating?" (e.g., entire body; just face, underarms, palms, or soles of feet).      Guardian is unsure; this happens at work 3. SEVERITY: "How bad is the sweating?"    (Scale 1-10; or mild, moderate, severe)   -  MILD (1-3): doesn't interfere with normal activities    -  MODERATE (4-7): interferes with normal activities (e.g., work or school) or awakens from sleep; causes embarrassment in social situations    -  SEVERE (8-10): drenching sweats and has to change bed clothes or bed linens.     mild 4. CAUSE: "What do you think is causing the sweating?"     unsure 5. FEVER: "Have you been having fevers?"     no 6. OTHER SYMPTOMS: "Do you have any other symptoms?" (e.g., chest pain, difficulty breathing, lightheadedness, weight loss)     Increased thirst; unable to complete what she is doing, increased urination  Protocols used: [facility]

## 2017-08-14 ENCOUNTER — Encounter: Payer: Self-pay | Admitting: Family Medicine

## 2017-08-14 ENCOUNTER — Ambulatory Visit (INDEPENDENT_AMBULATORY_CARE_PROVIDER_SITE_OTHER): Payer: PPO | Admitting: Family Medicine

## 2017-08-14 VITALS — BP 102/70 | HR 71 | Temp 98.5°F | Resp 12 | Wt 149.4 lb

## 2017-08-14 DIAGNOSIS — F79 Unspecified intellectual disabilities: Secondary | ICD-10-CM | POA: Diagnosis not present

## 2017-08-14 DIAGNOSIS — F419 Anxiety disorder, unspecified: Secondary | ICD-10-CM | POA: Diagnosis not present

## 2017-08-14 DIAGNOSIS — R631 Polydipsia: Secondary | ICD-10-CM | POA: Diagnosis not present

## 2017-08-14 DIAGNOSIS — G4700 Insomnia, unspecified: Secondary | ICD-10-CM

## 2017-08-14 LAB — BASIC METABOLIC PANEL
BUN: 11 mg/dL (ref 6–23)
CHLORIDE: 105 meq/L (ref 96–112)
CO2: 26 mEq/L (ref 19–32)
Calcium: 9 mg/dL (ref 8.4–10.5)
Creatinine, Ser: 0.93 mg/dL (ref 0.40–1.20)
GFR: 85.27 mL/min (ref >60.00)
GLUCOSE: 107 mg/dL — AB (ref 70–99)
POTASSIUM: 4.1 meq/L (ref 3.5–5.1)
SODIUM: 137 meq/L (ref 135–145)

## 2017-08-14 LAB — HEMOGLOBIN A1C: Hgb A1c MFr Bld: 5.9 % (ref 4.6–6.5)

## 2017-08-14 LAB — TSH: TSH: 2.44 u[IU]/mL (ref 0.35–4.50)

## 2017-08-14 MED ORDER — SERTRALINE HCL 50 MG PO TABS: 50.0000 mg | ORAL_TABLET | Freq: Every day | ORAL | 1 refills | Status: DC

## 2017-08-14 NOTE — Patient Instructions (Addendum)
A few things to remember from today's visit:   Intellectual disability  Insomnia, unspecified type  Anxiety disorder, unspecified type - Plan: sertraline (ZOLOFT) 50 MG tablet  Polydipsia - Plan: Basic metabolic panel, Hemoglobin A1c, TSH  Today we started Sertraline, this type of medications can increase suicidal risk. This is more prevalent among children,adolecents, and young adults with major depression or other psychiatric disorders. It can also make depression worse. Most common side effects are gastrointestinal, self limited after a few weeks: diarrhea, nausea, constipation  Or diarrhea among some.  In general it is well tolerated. We will follow closely.      PSYCHIATRIC OFFICES AND PSYCHIATRISTS IN THE AREA   This is a list of options around [place], [place] that you can call and arrange an appointment.  -[facility] [phone] -[facility] [phone] [facility] [phone] -[facility] [phone]  Dr Hardie Shackleton [phone] Dr Alexander Bergeron, MD [phone] Dr Chucky May, MD [phone])   Dr Allayne Butcher. Marcelino Freestone, MD [phone] Dr Geralyn Flash A. Lugo  Dr Sheralyn Boatman, MD [phone])  Dr Fredonia Highland, MD [phone]   Please be sure medication list is accurate. If a new problem present, please set up appointment sooner than planned today.

## 2017-08-14 NOTE — Telephone Encounter (Signed)
She has an appt today,we will discuss this problem during visit. Recently underwent hysterectomy due to DUB.  Betty Martinique, MD

## 2017-08-14 NOTE — Progress Notes (Signed)
ACUTE VISIT   HPI:  Chief Complaint  Patient presents with  . Behavior Problem    [name] is a 42 y.o. female, who is here today with her aunt, who is concerned about "personality changes" after she had hysterectomy 06/16/17. Her aunt provides history.  She does "not know her job anymore." She is not completing tasks, she has difficulty with "focusing" on tasks. All these symptoms are new.  She has Hx of mental disability. No known Hx of depression. She moved with her aunt at age 26, she is not sure of any psychiatric disorder.  Hx of anemia due to DUB and fibroids. She had no complications from surgery,recovered well. According to her aunt, she wanted to go back to work the next day of surgery but she had to wait 5 weeks before she could do so after surgery.When she was back,she was not sure how to perform her job, she does "not know what to do." At night she is "hyper", she wants to clean and do house chores.  Anxiety,asking her aunt frequently if she needs something,if she is ok.She "jumps" with noises at night.  Denies depressed mood.  Denies any recent traumatic event.  She is eating well.  Her aunt is also concerned about sweating and drinking fluids more than usual. No Hx of DM.  -She wakes up a few times at night, goes to the bathroom. Her aunt is not sure if she goes to the bathroom because she is already awake.This is a chronic problem.   2 brother with ADHD. Aunt with bipolar disorder.    Review of Systems  Constitutional: Negative for activity change, appetite change, fatigue and fever.  HENT: Negative for mouth sores, nosebleeds and trouble swallowing.   Eyes: Negative for redness and visual disturbance.  Respiratory: Negative for cough, shortness of breath and wheezing.   Cardiovascular: Negative for chest pain, palpitations and leg swelling.  Gastrointestinal: Negative for abdominal pain, nausea and vomiting.       Negative for  changes in bowel habits.  Endocrine: Positive for polydipsia. Negative for cold intolerance, heat intolerance, polyphagia and polyuria.  Genitourinary: Negative for decreased urine volume, dysuria and hematuria.  Musculoskeletal: Negative for gait problem and myalgias.  Neurological: Negative for seizures, syncope, weakness and headaches.  Psychiatric/Behavioral: Positive for sleep disturbance. Negative for hallucinations and suicidal ideas. The patient is nervous/anxious.       Current Outpatient Medications on File Prior to Visit  Medication Sig Dispense Refill  . ibuprofen (ADVIL,MOTRIN) 800 MG tablet Take 1 tablet (800 mg total) by mouth every 8 (eight) hours as needed. 30 tablet 0   No current facility-administered medications on file prior to visit.      Past Medical History:  Diagnosis Date  . Asthma    8-10 years since having issues with asthma  . Fibroid   . Mentally challenged    No Known Allergies  Social History   Socioeconomic History  . Marital status: Single    Spouse name: Not on file  . Number of children: Not on file  . Years of education: Not on file  . Highest education level: Not on file  Occupational History  . Not on file  Social Needs  . Financial resource strain: Not on file  . Food insecurity:    Worry: Not on file    Inability: Not on file  . Transportation needs:    Medical: Not on file    Non-medical:  Not on file  Tobacco Use  . Smoking status: Never Smoker  . Smokeless tobacco: Never Used  Substance and Sexual Activity  . Alcohol use: No  . Drug use: No  . Sexual activity: Never  Lifestyle  . Physical activity:    Days per week: Not on file    Minutes per session: Not on file  . Stress: Not on file  Relationships  . Social connections:    Talks on phone: Not on file    Gets together: Not on file    Attends religious service: Not on file    Active member of club or organization: Not on file    Attends meetings of clubs or  organizations: Not on file    Relationship status: Not on file  Other Topics Concern  . Not on file  Social History Narrative  . Not on file    Vitals:   08/14/17 0750  BP: 102/70  Pulse: 71  Resp: 12  Temp: 98.5 F (36.9 C)  SpO2: 94%   Body mass index is 30.18 kg/m.   Physical Exam  Nursing note and vitals reviewed. Constitutional: She is oriented to person, place, and time. She appears well-developed. No distress.  HENT:  Head: Normocephalic and atraumatic.  Mouth/Throat: Oropharynx is clear and moist and mucous membranes are normal.  Eyes: Pupils are equal, round, and reactive to light. Conjunctivae are normal.  Neck: No thyroid mass and no thyromegaly present.  Cardiovascular: Normal rate and regular rhythm.  No murmur heard. Pulses:      Dorsalis pedis pulses are 2+ on the right side, and 2+ on the left side.  Respiratory: Effort normal and breath sounds normal. No respiratory distress.  GI: Soft. She exhibits no mass. There is no hepatomegaly. There is no tenderness.  Musculoskeletal: She exhibits no edema.  Lymphadenopathy:    She has no cervical adenopathy.  Neurological: She is alert and oriented to person, place, and time. She has normal strength. Gait normal.  Skin: Skin is warm. No rash noted. No erythema.  Psychiatric: Her mood appears not anxious. She is slowed. She does not exhibit a depressed mood.  Well groomed, good eye contact.     ASSESSMENT AND PLAN:   Ms. Catherine Hays was seen today for behavior problem.  Diagnoses and all orders for this visit:  Insomnia, unspecified type  Possible etiologies discussed,bipolar disorder also to be considered. Good sleep hygiene. Managing anxiety may help. F/U in 4 weeks.   Intellectual disability  The fact that she had not worked for 5 weeks may have affect her job performance,she may need re-training.  Anxiety disorder, unspecified type  After discussion of pharmacologic options,her aunt agrees with  trying Zoloft 25 mg daily for a week then 50 mg if well tolerated. Some side effects discussed. Instructed about warning signs. F/U in 4 weeks.  -     sertraline (ZOLOFT) 50 MG tablet; Take 1 tablet (50 mg total) by mouth daily.  Polydipsia  Possible causes discussed. Further recommendations will be given according to lab results.  -     Basic metabolic panel -     Hemoglobin A1c -     TSH      Return in about 1 month (around 09/11/2017) for anxiety.       Betty G. Martinique, MD  [facility] [place] office.

## 2017-08-16 ENCOUNTER — Encounter: Payer: Self-pay | Admitting: Family Medicine

## 2017-09-16 ENCOUNTER — Telehealth: Payer: Self-pay | Admitting: *Deleted

## 2017-09-16 NOTE — Telephone Encounter (Signed)
Refill request for Zoloft 50 mg tablet, 1 tablet by mouth every day

## 2017-09-19 ENCOUNTER — Other Ambulatory Visit: Payer: Self-pay | Admitting: Family Medicine

## 2017-09-19 DIAGNOSIS — F419 Anxiety disorder, unspecified: Secondary | ICD-10-CM

## 2017-09-19 MED ORDER — SERTRALINE HCL 50 MG PO TABS: 50.0000 mg | ORAL_TABLET | Freq: Every day | ORAL | 0 refills | Status: DC

## 2017-09-19 NOTE — Telephone Encounter (Signed)
Sertraline 50 mg will be sent to her pharmacy. Please remind her she was supposed to have a 4 weeks follow-up.  Thanks, BJ

## 2017-09-22 ENCOUNTER — Telehealth: Payer: Self-pay | Admitting: *Deleted

## 2017-09-22 ENCOUNTER — Other Ambulatory Visit: Payer: Self-pay | Admitting: *Deleted

## 2017-09-22 DIAGNOSIS — Z1239 Encounter for other screening for malignant neoplasm of breast: Secondary | ICD-10-CM

## 2017-09-22 NOTE — Telephone Encounter (Signed)
Left message to give clinic a call back concerning medication. 

## 2017-09-22 NOTE — Telephone Encounter (Signed)
Copied from [place] [id number]. Topic: Inquiry >> Sep 22, 2017  7:30 AM Pricilla Handler wrote: Reason for CRM: Patient's guardian [name] called inquiring how long she wants the patient to take Zoloft. Also, should the patient conmtinue to take a whole pill each day, and for how long.   Letta Median also wants Dr. Martinique to order a Mammogram for the patient at [facility] in August 2019.   Please call Letta Median at [phone].          Thank You!!!

## 2017-09-22 NOTE — Telephone Encounter (Signed)
Spoke with [name], patient's guardian, gave instructions per Dr. Martinique on medication. Patient scheduled on 10/21/17 for follow-up.

## 2017-09-22 NOTE — Telephone Encounter (Signed)
Spoke with Mrs. Catherine Hays and gave instructions or patient to take medication 1 tablet daily per Dr. Martinique. Referral for mammogram placed as requested.

## 2017-10-21 ENCOUNTER — Encounter: Payer: Self-pay | Admitting: Family Medicine

## 2017-10-21 ENCOUNTER — Ambulatory Visit (INDEPENDENT_AMBULATORY_CARE_PROVIDER_SITE_OTHER): Payer: PPO | Admitting: Family Medicine

## 2017-10-21 VITALS — BP 110/70 | HR 71 | Temp 98.5°F | Resp 12 | Ht 59.0 in | Wt 142.2 lb

## 2017-10-21 DIAGNOSIS — F79 Unspecified intellectual disabilities: Secondary | ICD-10-CM

## 2017-10-21 DIAGNOSIS — F419 Anxiety disorder, unspecified: Secondary | ICD-10-CM | POA: Diagnosis not present

## 2017-10-21 MED ORDER — SERTRALINE HCL 50 MG PO TABS: 50.0000 mg | ORAL_TABLET | Freq: Every day | ORAL | 1 refills | Status: DC

## 2017-10-21 NOTE — Patient Instructions (Addendum)
A few things to remember from today's visit:   Intellectual disability  Anxiety disorder, unspecified type  No changes today. Keep appt with psychologist.   Please be sure medication list is accurate. If a new problem present, please set up appointment sooner than planned today.

## 2017-10-21 NOTE — Progress Notes (Signed)
HPI:   Catherine Hays is a 42 y.o. female, who is here today with her aunt to follow on recent OV.  Her aunt provides history.  She was seen on 08/14/17. She was started on Zoloft 50 mg for anxiety.  In general Zoloft has helped with anxiety, she has some mild anxiety around 9:51 PM when she is getting ready  to go to bed. She is sleeping better, her aunt states that she wakes up easily with any noise but able to go back to sleep.  No depressed mood or suicidal thoughts.  She is back to work and employer has reported no problems with performing her job.   Her aunt is concerned about Catherine Hays showing "no emotion." She feels like her personality changed "a lot"after she had hysterectomy.  She would like to know "what age is she behaving like." She has taking care of Catherine Hays sine she was 61-95 yo and at that time she was told she was behaving like a 89 yo child.  She has an appt for psychologic evaluation but she is not sure if this what Catherine Hays needs at this point.  Her aunt has Hx of bipolar disorder and depression.    Review of Systems  Constitutional: Negative for activity change, appetite change, fatigue and unexpected weight change.  Respiratory: Negative for chest tightness, shortness of breath and wheezing.   Cardiovascular: Negative for chest pain, palpitations and leg swelling.  Gastrointestinal: Negative for abdominal pain, diarrhea, nausea and vomiting.  Musculoskeletal: Negative for gait problem and myalgias.  Neurological: Negative for tremors and headaches.  Psychiatric/Behavioral: Negative for confusion, hallucinations, sleep disturbance and suicidal ideas. The patient is not nervous/anxious.       No current outpatient medications on file prior to visit.   No current facility-administered medications on file prior to visit.      Past Medical History:  Diagnosis Date  . Asthma    8-10 years since having issues with asthma  . Fibroid   .  Mentally challenged    No Known Allergies  Social History   Socioeconomic History  . Marital status: Single    Spouse name: Not on file  . Number of children: Not on file  . Years of education: Not on file  . Highest education level: Not on file  Occupational History  . Not on file  Social Needs  . Financial resource strain: Not on file  . Food insecurity:    Worry: Not on file    Inability: Not on file  . Transportation needs:    Medical: Not on file    Non-medical: Not on file  Tobacco Use  . Smoking status: Never Smoker  . Smokeless tobacco: Never Used  Substance and Sexual Activity  . Alcohol use: No  . Drug use: No  . Sexual activity: Never  Lifestyle  . Physical activity:    Days per week: Not on file    Minutes per session: Not on file  . Stress: Not on file  Relationships  . Social connections:    Talks on phone: Not on file    Gets together: Not on file    Attends religious service: Not on file    Active member of club or organization: Not on file    Attends meetings of clubs or organizations: Not on file    Relationship status: Not on file  Other Topics Concern  . Not on file  Social History Narrative  . Not  on file    Vitals:   10/21/17 0837  BP: 110/70  Pulse: 71  Resp: 12  Temp: 98.5 F (36.9 C)  SpO2: 99%   Body mass index is 28.73 kg/m.      Physical Exam  Nursing note and vitals reviewed. Constitutional: She appears well-developed. She is cooperative. No distress.  HENT:  Head: Normocephalic.  Mouth/Throat: Oropharynx is clear and moist and mucous membranes are normal.  Eyes: Pupils are equal, round, and reactive to light. Conjunctivae are normal.  Cardiovascular: Normal rate and regular rhythm.  No murmur heard. Respiratory: Effort normal and breath sounds normal. No respiratory distress.  Musculoskeletal: She exhibits no edema.  Lymphadenopathy:    She has no cervical adenopathy.  Neurological: She is alert. She has normal  strength. Gait normal.  Skin: Skin is warm. No erythema.  Psychiatric: She has a normal mood and affect. Her speech is delayed. She is slowed. She expresses no suicidal ideation. She expresses no suicidal plans.  Well groomed, good eye contact.    ASSESSMENT AND PLAN:  Catherine Hays was seen today for follow-up.  Diagnoses and all orders for this visit:  Intellectual disability  Her aunt is very concerned about her behavior.She is not aggressive and anxiety has improved.  Explained that it is hard to determine what age she is behaving like,I do not think it would change prognosis or treatment. Given FHx of psychiatric disorders like bipolar (aunt and mother) I think it is important for her to be evaluated by psychologist and psychiatrist.So strongly recommend keeping appt.   Anxiety disorder, unspecified Greatly improved with Zoloft 50 mg. Her aunt still feels like she is not completely back to her "normal" personality before hysterectomy. For now I do not recommend increasing dose. Keep appt with psychologist. F/U in 3-4 months,before if needed.   25 min face to face OV. > 50% was dedicated to discussion of differential Dx, prognosis, treatment options for anxiety, and some side effects of medications. If she can establish with psychiatrist f/u appt can be cancelled.       Betty G. Martinique, MD  [facility]. [place] office.

## 2017-10-21 NOTE — Assessment & Plan Note (Signed)
Greatly improved with Zoloft 50 mg. Her aunt still feels like she is not completely back to her "normal" personality before hysterectomy. For now I do not recommend increasing dose. Keep appt with psychologist. F/U in 3-4 months,before if needed.

## 2017-11-05 ENCOUNTER — Ambulatory Visit: Payer: PPO | Admitting: Psychology

## 2017-12-08 ENCOUNTER — Ambulatory Visit (INDEPENDENT_AMBULATORY_CARE_PROVIDER_SITE_OTHER): Payer: PPO | Admitting: Family Medicine

## 2017-12-08 ENCOUNTER — Encounter: Payer: Self-pay | Admitting: Family Medicine

## 2017-12-08 VITALS — BP 110/76 | HR 78 | Temp 98.5°F | Resp 12 | Wt 143.0 lb

## 2017-12-08 DIAGNOSIS — E663 Overweight: Secondary | ICD-10-CM | POA: Diagnosis not present

## 2017-12-08 DIAGNOSIS — F419 Anxiety disorder, unspecified: Secondary | ICD-10-CM

## 2017-12-08 DIAGNOSIS — Z23 Encounter for immunization: Secondary | ICD-10-CM

## 2017-12-08 MED ORDER — SERTRALINE HCL 50 MG PO TABS: 50.0000 mg | ORAL_TABLET | Freq: Every day | ORAL | 1 refills | Status: AC

## 2017-12-08 NOTE — Assessment & Plan Note (Signed)
Well-controlled. She is tolerating medication well, so no changes in Zoloft 50 mg daily. Instructed about warning signs. Follow-up in 6 months, before if needed.

## 2017-12-08 NOTE — Progress Notes (Signed)
HPI:   [name] is a 42 y.o. female, who is here today with her cousin to follow on recent OV.   She has Hx of MR, so her cousin provides history. S/P hysterectomy due to DUB.  She was last seen on 10/21/17,at that time she was with her aunt. She was concerned about [place]'s personality not being as it was before hysterectomy. 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 She was started on Zoloft 50 mg,whish she has tolerated well.  According to her cousin,she is now back to her "normal."  Negative for depressed mood or suicidal thoughts.  Last OV it was also a concerned about insomnia. She is sleeping well now, 8 to 10 hours daily. Negative for maniac like symptoms,irritability,or MS changes.   She is walking her dog daily and she is also active at work. She is following a healthy diet.     Review of Systems  Constitutional: Negative for activity change, appetite change, fatigue and unexpected weight change.  HENT: Negative for mouth sores and sore throat.   Respiratory: Negative for shortness of breath and wheezing.   Cardiovascular: Negative for chest pain, palpitations and leg swelling.  Gastrointestinal: Negative for abdominal pain, diarrhea, nausea and vomiting.  Neurological: Negative for tremors, seizures and headaches.  Psychiatric/Behavioral: Negative for confusion, hallucinations, sleep disturbance and suicidal ideas. The patient is not nervous/anxious.       No current outpatient medications on file prior to visit.   No current facility-administered medications on file prior to visit.      Past Medical History:  Diagnosis Date  . Asthma    8-10 years since having issues with asthma  . Fibroid   . Mentally challenged    No Known Allergies  Social History   Socioeconomic History  . Marital status: Single    Spouse name: Not on file  . Number of children: Not on file  . Years of education: Not  on file  . Highest education level: Not on file  Occupational History  . Not on file  Social Needs  . Financial resource strain: Not on file  . Food insecurity:    Worry: Not on file    Inability: Not on file  . Transportation needs:    Medical: Not on file    Non-medical: Not on file  Tobacco Use  . Smoking status: Never Smoker  . Smokeless tobacco: Never Used  Substance and Sexual Activity  . Alcohol use: No  . Drug use: No  . Sexual activity: Never  Lifestyle  . Physical activity:    Days per week: Not on file    Minutes per session: Not on file  . Stress: Not on file  Relationships  . Social connections:    Talks on phone: Not on file    Gets together: Not on file    Attends religious service: Not on file    Active member of club or organization: Not on file    Attends meetings of clubs or organizations: Not on file    Relationship status: Not on file  Other Topics Concern  . Not on file  Social History Narrative  . Not on file    Vitals:   12/08/17 1018  BP: 110/76  Pulse: 78  Resp: 12  Temp: 98.5 F (36.9 C)  SpO2: 98%   Body mass index is 28.88 kg/m. Wt Readings from Last 3 Encounters:  12/08/17 143  lb (64.9 kg)  10/21/17 142 lb 4 oz (64.5 kg)  08/14/17 149 lb 6.4 oz (67.8 kg)     Physical Exam  Nursing note and vitals reviewed. Constitutional: She is oriented to person, place, and time. She appears well-developed. No distress.  HENT:  Head: Normocephalic.  Mouth/Throat: Oropharynx is clear and moist and mucous membranes are normal.  Eyes: Pupils are equal, round, and reactive to light. Conjunctivae and EOM are normal.  Cardiovascular: Normal rate and regular rhythm.  No murmur heard. Respiratory: Effort normal and breath sounds normal. No respiratory distress.  GI: Soft. She exhibits no mass. There is no tenderness.  Musculoskeletal: She exhibits no edema.  Neurological: She is alert and oriented to person, place, and time. She has normal  strength. Gait normal.  Skin: Skin is warm. No erythema.  Psychiatric: She has a normal mood and affect. She is slowed. Cognition and memory are normal. She expresses suicidal ideation.  Well groomed,good eye contact.    ASSESSMENT AND PLAN:  Ms Storlie was here with ehr cousin to follow on anxiety.   Orders Placed This Encounter  Procedures  . Flu Vaccine QUAD 36+ mos IM     Overweight (BMI 25.0-29.9) Her weight has been otherwise stable since her last visit. Encourage to continue regular physical activity and a healthy diet. Adverse effects of obesity discussed.  Anxiety disorder, unspecified Well-controlled. She is tolerating medication well, so no changes in Zoloft 50 mg daily. Instructed about warning signs. Follow-up in 6 months, before if needed.   Need for immunization against influenza -     Flu Vaccine QUAD 36+ mos IM   15 min face to face OV. > 50% was dedicated to discussion of Dx, prognosis, and some side effects of medications as well as coordination of care.    Marya Lowden G. Martinique, MD  [facility]. [place] office.

## 2017-12-08 NOTE — Assessment & Plan Note (Signed)
Her weight has been otherwise stable since her last visit. Encourage to continue regular physical activity and a healthy diet. Adverse effects of obesity discussed.

## 2017-12-08 NOTE — Patient Instructions (Addendum)
A few things to remember from today's visit:   Need for immunization against influenza - Plan: Flu Vaccine QUAD 36+ mos IM  Anxiety disorder, unspecified type - Plan: sertraline (ZOLOFT) 50 MG tablet  No changes on sertraline today. I will see you back in 6 months, before if needed.  Please be sure medication list is accurate. If a new problem present, please set up appointment sooner than planned today.

## 2017-12-30 ENCOUNTER — Ambulatory Visit: Payer: PPO | Admitting: Psychology

## 2019-04-14 IMAGING — US US PELVIS COMPLETE
1 series · 14 of 25 positions shown · non-contrast
Comparison: None.

CLINICAL DATA: Dysfunctional uterine bleeding

EXAM:
TRANSABDOMINAL ULTRASOUND OF PELVIS
TECHNIQUE: Transabdominal ultrasound examination of the pelvis was performed
including evaluation of the uterus, ovaries, adnexal regions, and
pelvic cul-de-sac.

[Series 1: us pelvis complete · 0.19mm/px · 14 of 37 slices shown]
[im 1/37]
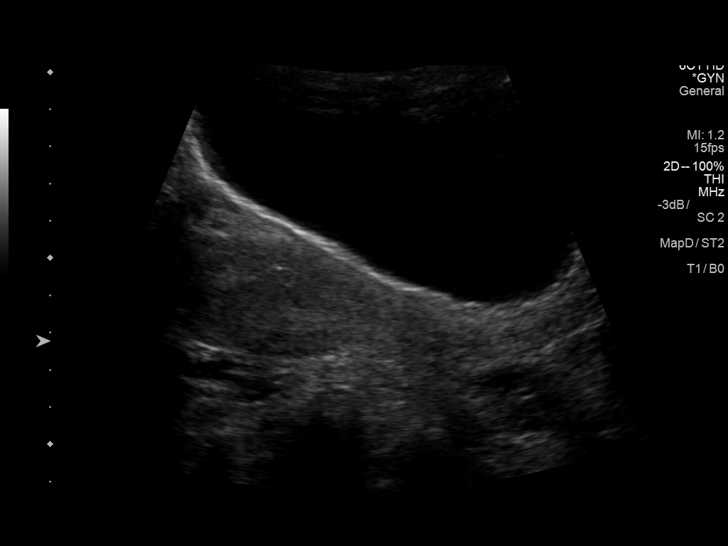
[im 4/37]
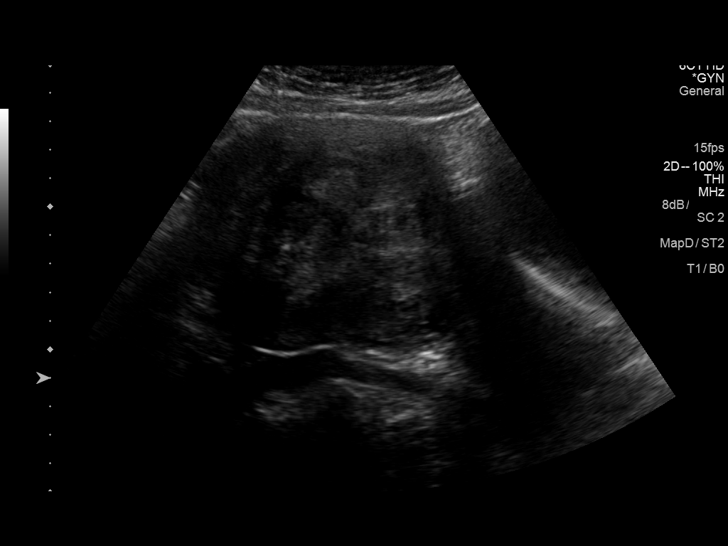
[im 7/37]
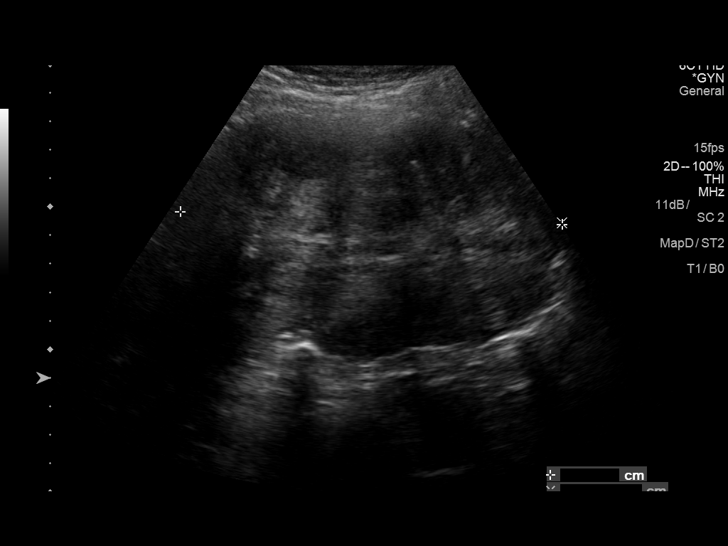
[im 10/37]
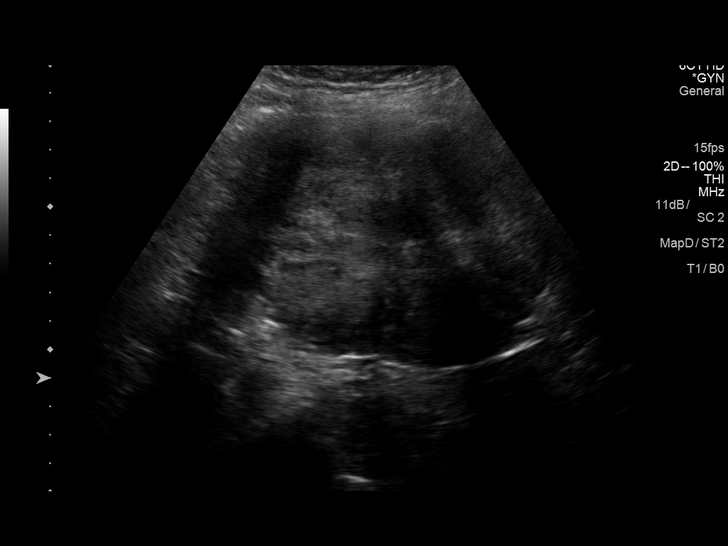
[im 13/37]
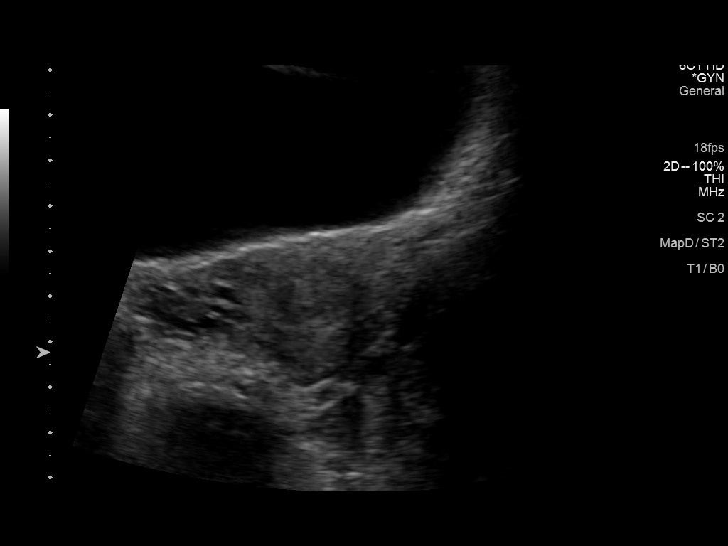
[im 14/37]
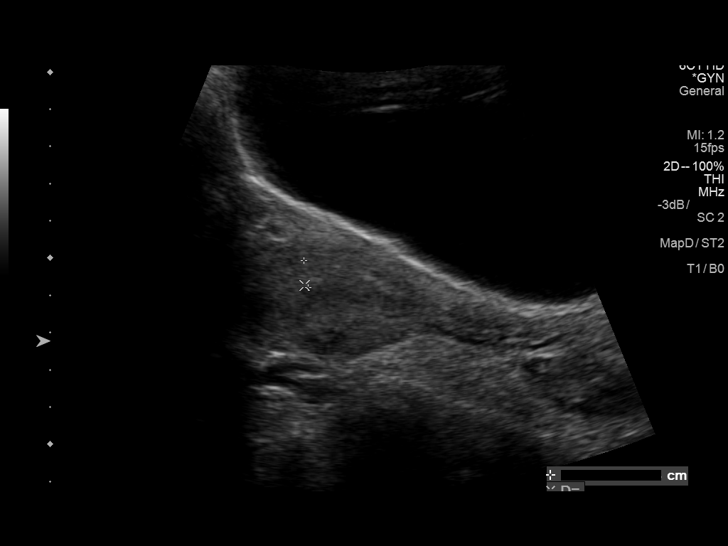
[im 17/37]
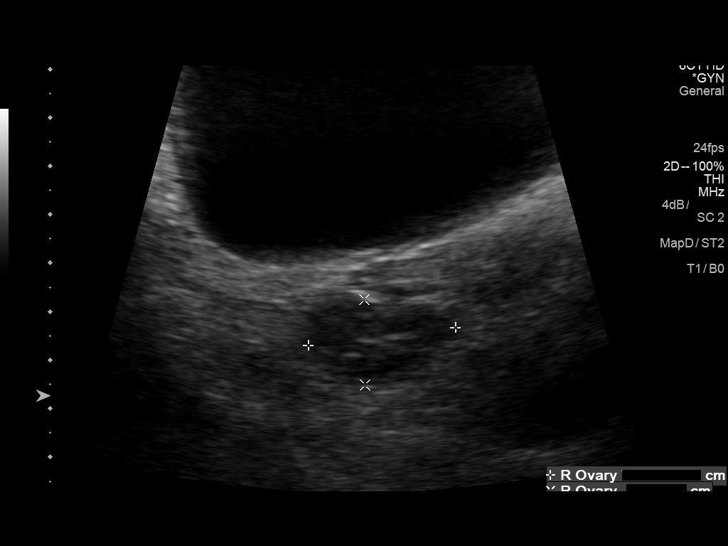
[im 20/37]
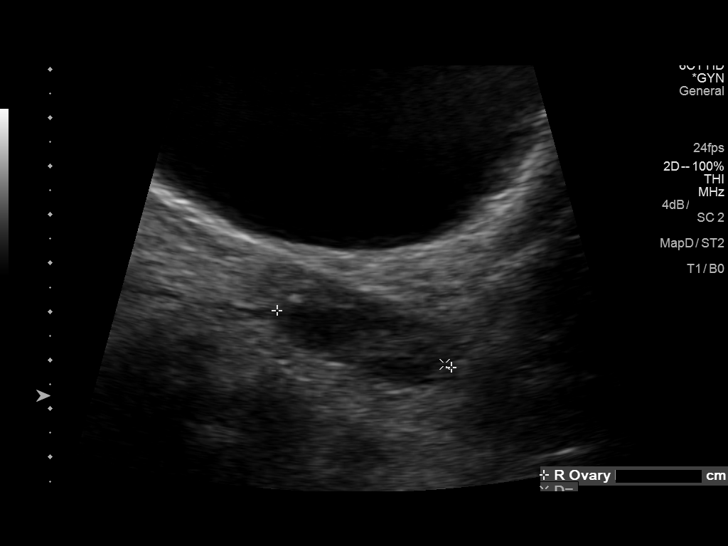
[im 23/37]
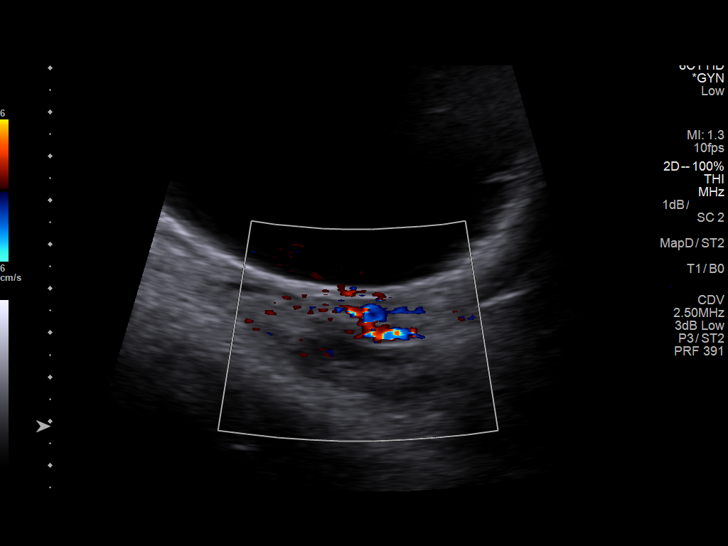
[im 25/37]
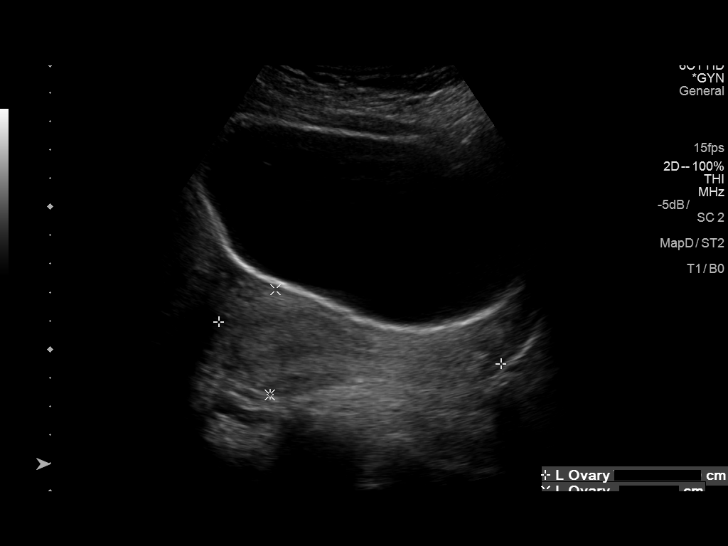
[im 28/37]
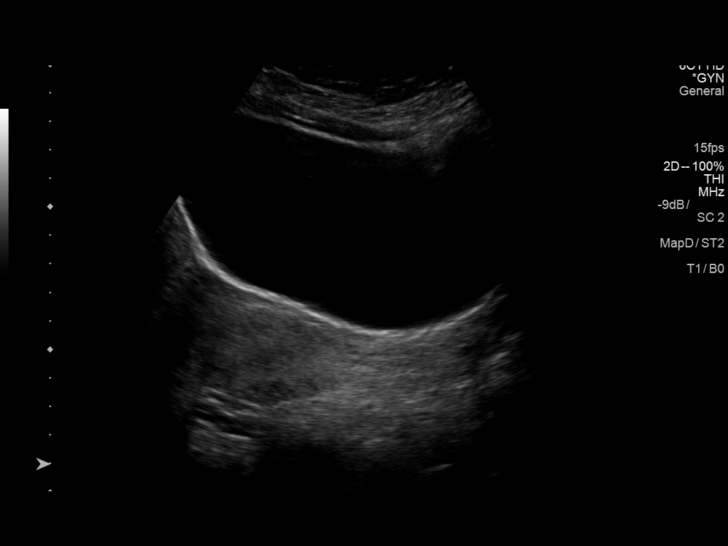
[im 31/37]
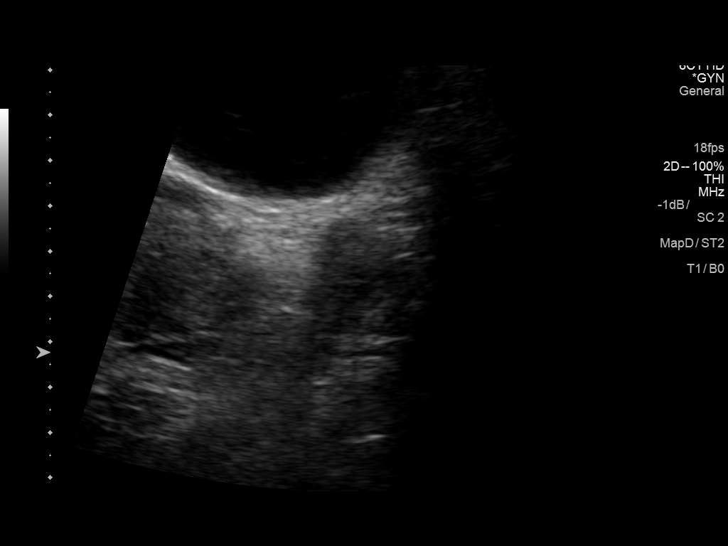
[im 34/37]
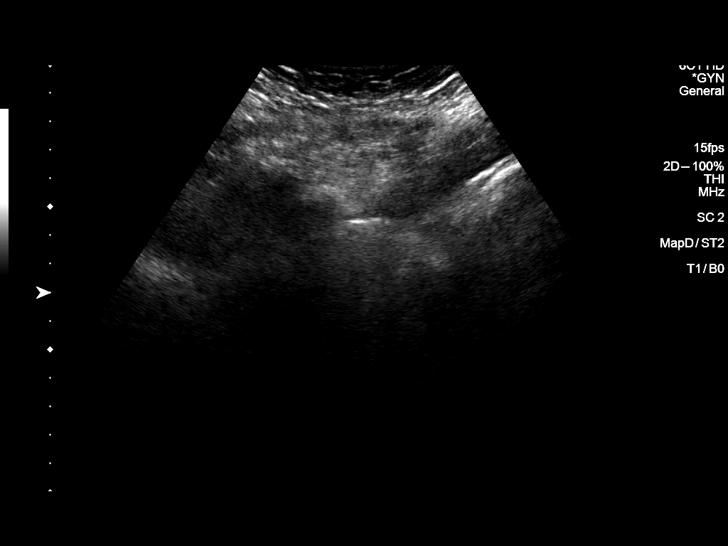
[im 37/37]
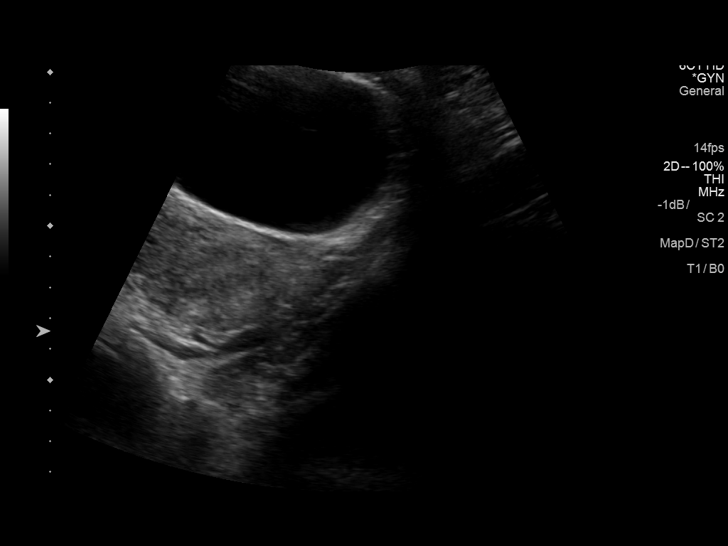

[14 of 25 positions shown; findings below may reference images not displayed]

FINDINGS: Uterus

Measurements: 10.0 x 3.7 x 4.3 cm. There is a mixed echogenicity
mass arising from the superior uterine fundus measuring 9.7 x 8.0 x
13.4 cm. No other uterine mass evident.

Endometrium

Thickness: 7 mm..  No focal abnormality visualized.

Right ovary

Measurements: 3.8 x 1.8 x 3.1 cm. Normal appearance/no adnexal mass.

Left ovary

Measurements: 2.2 x 2.7 x 1.4 cm. Normal appearance/no adnexal mass.

Other findings:  No abnormal free fluid.
IMPRESSION: Large leiomyoma measuring 9.7 x 8.0 x 13.4 cm arising from the
superior uterine fundus and peduncular in appearance. Study
otherwise unremarkable.

## 2020-04-13 ENCOUNTER — Telehealth: Payer: Self-pay | Admitting: Family Medicine

## 2020-04-13 NOTE — Telephone Encounter (Signed)
Tried calling patient to  schedule Medicare Annual Wellness Visit (AWV) either virtually or in office.   No answer  Last AWV no information lease schedule at anytime with [facility] Nurse Health Advisor 1 or 2   This should be a 45 minute visit  Patient needs appointment with pcp.

## 2020-05-19 ENCOUNTER — Telehealth: Payer: Self-pay | Admitting: Family Medicine

## 2020-05-19 NOTE — Telephone Encounter (Signed)
Tried calling patient to  schedule Medicare Annual Wellness Visit (AWV) either virtually or in office.  No answer  AWVI  please schedule at anytime with [facility] Nurse Health Advisor 1 or 2   This should be a 45 minute visit. Patient also needs appointment with PCP last appointment 12/08/17

## 2024-02-22 ENCOUNTER — Ambulatory Visit
Admission: EM | Admit: 2024-02-22 | Discharge: 2024-02-22 | Disposition: A | Discharge status: Home / Self Care | Attending: Student | Admitting: Student

## 2024-02-22 ENCOUNTER — Encounter: Payer: Self-pay | Admitting: Emergency Medicine

## 2024-02-22 DIAGNOSIS — L089 Local infection of the skin and subcutaneous tissue, unspecified: Secondary | ICD-10-CM

## 2024-02-22 MED ORDER — CEPHALEXIN 500 MG PO CAPS: 500.0000 mg | ORAL_CAPSULE | Freq: Four times a day (QID) | ORAL | 0 refills | Status: AC

## 2024-02-22 NOTE — ED Provider Notes (Signed)
 [facility]    CSN: [id number] Arrival date & time: 02/22/24  1409      History   Chief Complaint Chief Complaint  Patient presents with   Mass    HPI Rasheida Broden is a 48 y.o. female presenting w bump on nose x4 days.  Caregiver states that has a painful red bump on nose x 4 days. Patient has not had anything for symptoms.   HPI  Past Medical History:  Diagnosis Date   Asthma    8-10 years since having issues with asthma   Fibroid    Mentally challenged     Patient Active Problem List   Diagnosis Date Noted   Anxiety disorder, unspecified 10/21/2017   Status post laparoscopic hysterectomy 06/16/2017   Dysfunctional uterine bleeding 10/15/2013   Overweight (BMI 25.0-29.9) 05/15/2006   Intellectual disability 05/15/2006   RHINITIS, ALLERGIC 05/15/2006   ASTHMA, EXERCISE INDUCED 05/15/2006    Past Surgical History:  Procedure Laterality Date   ABDOMINAL HYSTERECTOMY     CYSTOSCOPY N/A 06/16/2017   Procedure: CYSTOSCOPY;  Surgeon: Jannis Kate Norris, MD;  Location: [facility];  Service: Gynecology;  Laterality: N/A;   DILATATION & CURETTAGE/HYSTEROSCOPY WITH MYOSURE N/A 10/08/2016   Procedure: DILATATION & CURETTAGE/HYSTEROSCOPY WITH MYOSURE;  Surgeon: Jannis Kate Norris, MD;  Location: [facility];  Service: Gynecology;  Laterality: N/A;  rep will be here confirmed 10/01/16.   DILATION AND CURETTAGE OF UTERUS     HERNIA REPAIR     Umbilical, per aunt faye she is unaware of this surgery   none     TONSILLECTOMY     TOTAL LAPAROSCOPIC HYSTERECTOMY WITH SALPINGECTOMY Bilateral 06/16/2017   Procedure: TOTAL LAPAROSCOPIC HYSTERECTOMY WITH SALPINGECTOMY, FUGERATION OF ENDOMETRIOSIS;  Surgeon: Jannis Kate Norris, MD;  Location: [facility];  Service: Gynecology;  Laterality: Bilateral;  4 hours OR time    OB History     Gravida  0   Para  0   Term  0   Preterm  0   AB  0   Living  0      SAB  0   IAB  0    Ectopic  0   Multiple  0   Live Births  0            Home Medications    Prior to Admission medications   Medication Sig Start Date End Date Taking? Authorizing Provider  cephALEXin  (KEFLEX ) 500 MG capsule Take 1 capsule (500 mg total) by mouth 4 (four) times daily. 02/22/24  Yes Horace Wishon E, PA-C  sertraline  (ZOLOFT ) 50 MG tablet Take 1 tablet (50 mg total) by mouth daily. 12/08/17   Jordan, Betty G, MD    Family History Family History  Problem Relation Age of Onset   Cancer Mother        breast   Heart disease Father    Hypertension Father    Heart attack Father    Hypertension Maternal Aunt    Kidney disease Maternal Aunt        dialysis   Thyroid  cancer Maternal Grandmother    Stroke Maternal Grandfather    Kidney failure Maternal Aunt    Thyroid  cancer Maternal Aunt     Social History Social History   Tobacco Use   Smoking status: Never   Smokeless tobacco: Never  Vaping Use   Vaping status: Never Used  Substance Use Topics   Alcohol use: No   Drug use: No  Allergies   Patient has no known allergies.   Review of Systems Review of Systems  Skin:  Positive for wound.     Physical Exam Triage Vital Signs ED Triage Vitals  Encounter Vitals Group     BP      Girls Systolic BP Percentile      Girls Diastolic BP Percentile      Boys Systolic BP Percentile      Boys Diastolic BP Percentile      Pulse      Resp      Temp      Temp src      SpO2      Weight      Height      Head Circumference      Peak Flow      Pain Score      Pain Loc      Pain Education      Exclude from Growth Chart    No data found.  Updated Vital Signs BP (!) 142/84 (BP Location: Left Arm)   Pulse 65   Temp 98.4 F (36.9 C) (Oral)   Resp 20   LMP 06/07/2017 (Approximate)   SpO2 95%   Visual Acuity Right Eye Distance:   Left Eye Distance:   Bilateral Distance:    Right Eye Near:   Left Eye Near:    Bilateral Near:     Physical Exam Vitals  reviewed.  Constitutional:      General: She is not in acute distress.    Appearance: Normal appearance. She is not ill-appearing.  HENT:     Head: Normocephalic and atraumatic.  Pulmonary:     Effort: Pulmonary effort is normal.  Skin:    Comments: See image below Tip of nose with erythema and mild tenderness, with overlying abrasion. There is no swelling in the nasal cavity or septum.  Neurological:     General: No focal deficit present.     Mental Status: She is alert and oriented to person, place, and time.  Psychiatric:        Mood and Affect: Mood normal.        Behavior: Behavior normal.        Thought Content: Thought content normal.        Judgment: Judgment normal.       UC Treatments / Results  Labs (all labs ordered are listed, but only abnormal results are displayed) Labs Reviewed - No data to display  EKG   Radiology No results found.  Procedures Procedures (including critical care time)  Medications Ordered in UC Medications - No data to display  Initial Impression / Assessment and Plan / UC Course  I have reviewed the triage vital signs and the nursing notes.  Pertinent labs & imaging results that were available during my care of the patient were reviewed by me and considered in my medical decision making (see chart for details).     Patient is a pleasant 48 y.o. female presenting with cellulitis of tip of nose. The patient is afebrile and nontachycardic.  Antipyretic has not been administered today. S/p hysterectomy  Keflex  as below. Wound care and return precautions discussed.   Final Clinical Impressions(s) / UC Diagnoses   Final diagnoses:  Facial infection     Discharge Instructions      -Keflex  antibiotic 4x daily x5 days -Wash your wound with gentle soap and water 1-2 times daily.  Let air dry or gently pat. You can  follow with over-the-counter neosporin ointment (or similar). Keep wrapped during the day or when you're doing  something that could get it dirty (working, [employer], cooking, catering manager). Avoid cleansing with hydrogen peroxide or alcohol!! -Seek additional medical attention if the wound is getting worse instead of better- redness increasing in size, pain getting worse, new/worsening discharge, new fevers/chills, etc.     ED Prescriptions     Medication Sig Dispense Auth. Provider   cephALEXin  (KEFLEX ) 500 MG capsule Take 1 capsule (500 mg total) by mouth 4 (four) times daily. 20 capsule Keyontae Huckeby E, PA-C      PDMP not reviewed this encounter.   Arlyss Leita BRAVO, PA-C 02/22/24 1539

## 2024-02-22 NOTE — ED Triage Notes (Signed)
 Caregiver states that has a painful red bump on nose x 4 days. Patient has not had anything for symptoms.

## 2024-02-22 NOTE — Discharge Instructions (Addendum)
-  Keflex  antibiotic 4x daily x5 days -Wash your wound with gentle soap and water 1-2 times daily.  Let air dry or gently pat. You can follow with over-the-counter neosporin ointment (or similar). Keep wrapped during the day or when you're doing something that could get it dirty (working, [employer], cooking, catering manager). Avoid cleansing with hydrogen peroxide or alcohol!! -Seek additional medical attention if the wound is getting worse instead of better- redness increasing in size, pain getting worse, new/worsening discharge, new fevers/chills, etc.

## 2024-04-07 ENCOUNTER — Ambulatory Visit: Admitting: Family Medicine

## 2024-05-28 ENCOUNTER — Ambulatory Visit: Admitting: Family Medicine
